# Patient Record
Sex: Male | Born: 1950
Health system: Southern US, Community
[De-identification: ages and names within clinical notes are randomized; demographics above are authoritative.]

## PROBLEM LIST (undated history)

## (undated) DIAGNOSIS — I10 Essential (primary) hypertension: Secondary | ICD-10-CM

## (undated) DIAGNOSIS — M542 Cervicalgia: Secondary | ICD-10-CM

## (undated) DIAGNOSIS — K52839 Microscopic colitis, unspecified: Secondary | ICD-10-CM

## (undated) DIAGNOSIS — M779 Enthesopathy, unspecified: Secondary | ICD-10-CM

## (undated) DIAGNOSIS — F32A Depression, unspecified: Secondary | ICD-10-CM

## (undated) DIAGNOSIS — F329 Major depressive disorder, single episode, unspecified: Secondary | ICD-10-CM

## (undated) DIAGNOSIS — R51 Headache: Secondary | ICD-10-CM

## (undated) DIAGNOSIS — R519 Headache, unspecified: Secondary | ICD-10-CM

## (undated) HISTORY — PX: OTHER SURGICAL HISTORY: SHX169

## (undated) HISTORY — DX: Headache, unspecified: R51.9

## (undated) HISTORY — DX: Major depressive disorder, single episode, unspecified: F32.9

## (undated) HISTORY — DX: Depression, unspecified: F32.A

## (undated) HISTORY — DX: Cervicalgia: M54.2

## (undated) HISTORY — PX: EYE SURGERY: SHX253

## (undated) HISTORY — DX: Enthesopathy, unspecified: M77.9

## (undated) HISTORY — DX: Headache: R51

## (undated) HISTORY — DX: Microscopic colitis, unspecified: K52.839

## (undated) HISTORY — DX: Essential (primary) hypertension: I10

---

## 1999-06-28 ENCOUNTER — Encounter: Payer: Self-pay | Admitting: Neurosurgery

## 1999-06-28 ENCOUNTER — Encounter: Admission: RE | Admit: 1999-06-28 | Discharge: 1999-06-28 | Payer: Self-pay | Admitting: Neurosurgery

## 2003-06-10 ENCOUNTER — Ambulatory Visit (HOSPITAL_COMMUNITY): Admission: RE | Admit: 2003-06-10 | Discharge: 2003-06-10 | Payer: Self-pay | Admitting: Family Medicine

## 2004-03-26 ENCOUNTER — Emergency Department (HOSPITAL_COMMUNITY): Admission: EM | Admit: 2004-03-26 | Discharge: 2004-03-27 | Payer: Self-pay | Admitting: Emergency Medicine

## 2004-04-01 ENCOUNTER — Ambulatory Visit (HOSPITAL_COMMUNITY): Admission: RE | Admit: 2004-04-01 | Discharge: 2004-04-01 | Payer: Self-pay | Admitting: Family Medicine

## 2004-04-08 ENCOUNTER — Ambulatory Visit (HOSPITAL_COMMUNITY): Admission: RE | Admit: 2004-04-08 | Discharge: 2004-04-08 | Payer: Self-pay | Admitting: General Surgery

## 2004-06-15 ENCOUNTER — Ambulatory Visit: Payer: Self-pay | Admitting: Urgent Care

## 2004-06-20 ENCOUNTER — Ambulatory Visit (HOSPITAL_COMMUNITY): Admission: RE | Admit: 2004-06-20 | Discharge: 2004-06-20 | Payer: Self-pay | Admitting: Internal Medicine

## 2004-07-14 ENCOUNTER — Ambulatory Visit: Payer: Self-pay | Admitting: Internal Medicine

## 2004-08-01 ENCOUNTER — Ambulatory Visit: Payer: Self-pay | Admitting: Internal Medicine

## 2004-08-05 ENCOUNTER — Ambulatory Visit (HOSPITAL_COMMUNITY): Admission: RE | Admit: 2004-08-05 | Discharge: 2004-08-05 | Payer: Self-pay | Admitting: Internal Medicine

## 2004-08-05 ENCOUNTER — Ambulatory Visit: Payer: Self-pay | Admitting: Internal Medicine

## 2004-09-16 ENCOUNTER — Ambulatory Visit: Payer: Self-pay | Admitting: Internal Medicine

## 2004-11-08 ENCOUNTER — Ambulatory Visit: Payer: Self-pay | Admitting: Internal Medicine

## 2004-12-05 ENCOUNTER — Ambulatory Visit: Payer: Self-pay | Admitting: Internal Medicine

## 2005-01-30 ENCOUNTER — Ambulatory Visit: Payer: Self-pay | Admitting: Internal Medicine

## 2005-03-13 ENCOUNTER — Ambulatory Visit: Payer: Self-pay | Admitting: Internal Medicine

## 2005-05-05 ENCOUNTER — Ambulatory Visit: Payer: Self-pay | Admitting: Internal Medicine

## 2005-07-31 ENCOUNTER — Ambulatory Visit: Payer: Self-pay | Admitting: Internal Medicine

## 2005-08-09 ENCOUNTER — Ambulatory Visit (HOSPITAL_COMMUNITY): Admission: RE | Admit: 2005-08-09 | Discharge: 2005-08-09 | Payer: Self-pay | Admitting: Internal Medicine

## 2005-08-16 ENCOUNTER — Ambulatory Visit (HOSPITAL_COMMUNITY): Admission: RE | Admit: 2005-08-16 | Discharge: 2005-08-16 | Payer: Self-pay | Admitting: Internal Medicine

## 2005-09-11 ENCOUNTER — Ambulatory Visit: Payer: Self-pay | Admitting: Internal Medicine

## 2006-09-03 ENCOUNTER — Ambulatory Visit (HOSPITAL_COMMUNITY): Admission: RE | Admit: 2006-09-03 | Discharge: 2006-09-03 | Payer: Self-pay | Admitting: Family Medicine

## 2007-11-11 ENCOUNTER — Ambulatory Visit (HOSPITAL_COMMUNITY): Admission: RE | Admit: 2007-11-11 | Discharge: 2007-11-11 | Payer: Self-pay | Admitting: Family Medicine

## 2008-12-09 ENCOUNTER — Ambulatory Visit (HOSPITAL_COMMUNITY): Admission: RE | Admit: 2008-12-09 | Discharge: 2008-12-09 | Payer: Self-pay | Admitting: Family Medicine

## 2010-01-26 ENCOUNTER — Ambulatory Visit (HOSPITAL_COMMUNITY): Admission: RE | Admit: 2010-01-26 | Discharge: 2010-01-26 | Payer: Self-pay | Admitting: Family Medicine

## 2010-02-03 ENCOUNTER — Ambulatory Visit (HOSPITAL_COMMUNITY): Admission: RE | Admit: 2010-02-03 | Discharge: 2010-02-03 | Payer: Self-pay | Admitting: Family Medicine

## 2010-03-18 ENCOUNTER — Ambulatory Visit (HOSPITAL_COMMUNITY): Admission: RE | Admit: 2010-03-18 | Discharge: 2010-03-18 | Payer: Self-pay | Admitting: Family Medicine

## 2010-10-09 LAB — BASIC METABOLIC PANEL
CO2: 29 mEq/L (ref 19–32)
Chloride: 98 mEq/L (ref 96–112)
GFR calc Af Amer: >60 mL/min (ref >60)
GFR calc non Af Amer: >60 mL/min (ref >60)

## 2010-12-09 NOTE — Op Note (Signed)
NAMEGARY, Stephen Andersen              ACCOUNT NO.:  [id number]   MEDICAL RECORD NO.:  [id number]          PATIENT TYPE:  AMB   LOCATION:  DAY                           FACILITY:  [facility]   PHYSICIAN:  Lionel December, M.D.    DATE OF BIRTH:  [birth year]   DATE OF PROCEDURE:  08/05/2004  DATE OF DISCHARGE:                                 OPERATIVE REPORT   PROCEDURE:  Total colonoscopy with terminal ileoscopy.   [name] is 60 year old Caucasian male with 75-month history of diarrhea. He had  a normal colonoscopy by Dr. Lovell Sheehan in September 2005. 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 He was treated with prednisone and started  to have normal BMs. Off therapy, he has developed copious watery diarrhea.  He is therefore undergoing colonoscopy with biopsy prior to further  therapeutic intervention. Procedures were reviewed with the patient.  Informed consent was obtained.   PREMEDICATION:  Demerol 25 mg IV Versed 6 mg IV.   FINDINGS:  Procedure performed in endoscopy suite. The patient's vital signs  and O2 sat were monitored during procedure remained stable. The patient was  placed left lateral position, and rectal examination performed. No  abnormality noted on external or digital exam. Olympus video scope was  placed in rectum and advanced into sigmoid colon and beyond. Preparation was  satisfactory. He had a few areas of petechiae in sigmoid colon with no  erosions or ulcers were noted. He had scattered small diverticula at the  sigmoid and descending colon. Scope was passed to the cecum which was  identified by appendiceal orifice and ileocecal valve. Pictures taken for  the record. Short segment of TI was also examined and revealed normal  mucosa. Biopsy was taken for from TI for routine histology. Biopsies also  taken from the ascending colon and sigmoid colon and submitted in separate  containers.  Rectal mucosa was normal. Scope was retroflexed to examine  anorectal junction and single hemorrhoid was noted below the dentate line.  Endoscope was straightened and withdrawn. The patient tolerated the  procedure well.   FINAL DIAGNOSIS:  No endoscopic evidence of colitis or terminal ileitis.  Biopsies taken from TI, ascending and sigmoid colon for routine histology.  Few scattered diverticula at sigmoid and descending colon. A single external  hemorrhoid.   RECOMMENDATIONS:  CBC will be checked today. If eosinophilia is documented,  he will back on prednisone. Otherwise will wait for further recommendation  until histology reviewed. In the meantime, he will discontinue Imodium and  start Lomotil one t.i.d. or q.i.d.. He will continue Levsin SL before each  meal.     Naje   NR/MEDQ  D:  08/05/2004  T:  08/05/2004  Job:  [id number]   cc:   Corrie Mckusick, M.D.  Fax: [phone]

## 2010-12-09 NOTE — H&P (Signed)
NAME:  Stephen Andersen, Stephen Andersen                        ACCOUNT NO.:  [id number]   MEDICAL RECORD NO.:  [id number]                   PATIENT TYPE:  OUT   LOCATION:  RAD                                  FACILITY:  [facility]   PHYSICIAN:  Dalia Heading, M.D.               DATE OF BIRTH:  1950-10-18   DATE OF ADMISSION:  04/01/2004  DATE OF DISCHARGE:  04/01/2004                                HISTORY & PHYSICAL   CHIEF COMPLAINT:  Abdominal pain, diarrhea.   HISTORY OF PRESENT ILLNESS:  The patient is a 60 year old white male who is  referred for endoscopic evaluation.  He needs colonoscopy for diarrhea and  abdominal pain.  Both have been present for over five weeks.  No weight  loss, nausea, vomiting, melena, constipation, hematochezia have been noted.  He has never had a colonoscopy.  There is no family history of colon  carcinoma.  There is no history of hemorrhoidal disease.   PAST MEDICAL HISTORY:  1.  Depression.  2.  Nerve problems.   PAST SURGICAL HISTORY:  Unremarkable.   CURRENT MEDICATIONS:  1.  Lorcet.  2.  Valium.  3.  Neurontin.  4.  Lexapro.   ALLERGIES:  No known drug allergies.   REVIEW OF SYSTEMS:  The patient does smoke a pack of cigarettes a day.  He  denies any alcohol use.  He denies any other cardiopulmonary difficulties or  bleeding disorders.   PHYSICAL EXAMINATION:  GENERAL:  The patient is a well-developed, well-  nourished white male in no acute distress.  VITAL SIGNS:  Afebrile and vital signs are stable.  LUNGS:  Clear to auscultation with equal breath sounds bilaterally.  HEART:  Regular rate and rhythm without S3, S4, or murmurs.  ABDOMEN:  Soft, nontender, nondistended.  No hepatosplenomegaly or masses  are noted.  RECTAL:  Deferred to the procedure.   IMPRESSION:  1.  Abdominal pain.  2.  Diarrhea.   PLAN:  The patient is scheduled for a colonoscopy on April 08, 2004.  The risks and benefits of the procedure including bleeding and  perforation  were fully explained to the patient, who gave informed consent.     ___________________________________________                                         Dalia Heading, M.D.   MAJ/MEDQ  D:  04/05/2004  T:  04/05/2004  Job:  [id number]   cc:   Corrie Mckusick, M.D.  [address] [place]  [place]  [place] [address]  Fax: [phone]

## 2010-12-09 NOTE — H&P (Signed)
NAMEBROWNING, SOUTHWOOD              ACCOUNT NO.:  [id number]   MEDICAL RECORD NO.:  [id number]           PATIENT TYPE:   LOCATION:                                FACILITY:  [facility]   PHYSICIAN:  R. Roetta Sessions, M.D. DATE OF BIRTH:  01/17/1951   DATE OF ADMISSION:  DATE OF DISCHARGE:  LH                                HISTORY & PHYSICAL   Date of visit:  August 01, 2004.   CHIEF COMPLAINT:  Colonoscopy, refractory diarrhea.   HISTORY OF PRESENT ILLNESS:  Mr. Steele is a 60 year old Caucasian male  who initially came to see [place] in November 2005.  He had a four-month history  of diarrhea at that time.  He was found to have eosinophilia and was started  on prednisone therapy.  Initially his eosinophil count was 1100.  He had  done quite well on the prednisone therapy up until he was seen today.  He  had finished his prednisone taper on July 19, 2004.  He began to have  persistent diarrhea again.  He is also complaining of weakness; therefore, a  CBC and metabolic panel were obtained.  He was found to have hyponatremia  with a sodium of 128.  He was started on prednisone 20 mg taper on that  date.  He presents yesterday, January 9, with worse diarrhea than before.  He is having up to 12 bowel movements, which are loose and watery, per day.  He is complaining of weakness, abdominal cramping, and low abdominal pain.  Denies any fever or chills.  Denies any rectal bleeding or melena.  He  denies any new medications.  Of note, last CBC from July 19, 2004, the  date he completed his prednisone therapy, his eosinophil count was normal,  as was his entire CBC.   CURRENT MEDICATIONS:  1.  Multivitamin daily.  2.  Aspirin 81 mg daily.  3.  Diazepam 2 mg t.i.d.  4.  Lexapro 20 mg one-half tablet daily.  5.  Neurontin 300 mg p.r.n.  6.  Hydrocodone/APAP 10/650 mg p.r.n.  7.  Levsin b.i.d.   PAST MEDICAL HISTORY:  1.  Cervical disk disease, arthritis, and degenerative joint disease.  2.  He is being followed by Dr. Jerre Simon for abnormal CT findings, including a      thickened bladder wall and an enlarged prostate felt to be benign, per      patient.  3.  Denies any surgical history.   ALLERGIES:  No known drug allergies.   FAMILY HISTORY:  No known family history of inflammatory bowel disease,  liver or chronic GI problems.   SOCIAL HISTORY:  Mr. Schrecengost has been married for 14 years.  He is  currently retired.  He reports a 20 pack-year tobacco use history.  He  denies any alcohol or drug use.   REVIEW OF SYSTEMS:  CONSTITUTIONAL:  Weight is up 3-1/2 pounds since  initially seen in our office two months ago.  He denies any night sweats.  He is complaining of significant fatigue.  GASTROINTESTINAL:  See HPI.   PHYSICAL EXAMINATION:  VITAL  SIGNS:  Weight 147.5 pounds, blood pressure  130/80, pulse 80.  GENERAL:  Mr. Lill is a well-developed, well-nourished Caucasian male in  no acute distress.  He is alert and oriented and pleasant and cooperative.  He is accompanied by his wife today.  HEENT:  Sclerae clear, nonicteric.  Conjunctivae pink and moist without any  lesions.  NECK:  Supple without adenopathy or thyromegaly.  CHEST:  Heart regular rate and rhythm, normal S1 and S2, without murmurs,  rubs, thrills, or gallops.  Lungs clear to auscultation bilaterally.  ABDOMEN:  Flat with positive bowel sounds x4.  No bruits auscultated.  Soft,  nontender, nondistended, without palpable mass or hepatosplenomegaly.  No  rebound tenderness or guarding.  EXTREMITIES:  2+ pedal pulses bilaterally.  No edema.  RECTAL:  Deferred.  SKIN:  Pink, warm and dry, without any rash or jaundice.  He does have good  skin turgor.   IMPRESSION:  Mr. Sanmiguel is a 60 year old Caucasian male with a now six-  month history of chronic large-volume diarrhea, felt to be due to  eosinophilic colitis, enterocolitis, or enteritis.  He responded somewhat to  prednisone therapy and would  go from 12 bowel movements a day down to about  three.  He had been doing better on prednisone.  As soon as prednisone was  stopped, the diarrhea began again with up to 12 bowel movements a day.  At  that time, there was no evidence of eosinophilia on complete blood count.  He is found to be hyponatremic.  Not mentioned above, he has had a CT scan  as well as small bowel follow-through, which showed a rapid small bowel  transit time of 30 minutes, on June 20, 2004.  CT scan was negative for  colitis.  CT scan was negative for colitis, lymphoma, etc.  He is requesting  colonoscopy today, and I feel that this is warranted given his refractory  symptoms.   RECOMMENDATIONS:  1.  Will schedule colonoscopy with Dr. Karilyn Cota in the near future.  I have      discussed this procedure, including risks and benefits which include but      are not limited to bleeding, infection, perforation, and drug reaction.      He agrees.  A signed consent will be obtained.  2.  A prescription was given for Levsin one tablet sublingual a.c. and h.s.      p.r.n. diarrhea, #60 with one refill.  3.  Further recommendations pending colonoscopy.  He should continue      prednisone taper at this time.     Kand   KC/MEDQ  D:  08/02/2004  T:  08/02/2004  Job:  [id number]   cc:   Corrie Mckusick, M.D.  Fax: [phone]

## 2011-08-24 ENCOUNTER — Telehealth (INDEPENDENT_AMBULATORY_CARE_PROVIDER_SITE_OTHER): Payer: Self-pay | Admitting: *Deleted

## 2011-08-24 NOTE — Telephone Encounter (Signed)
Stephen Andersen wanting Dr. Karilyn Cota to give him a call at [phone]. He is almost out of his  asacol medication. Please call this in to [facility]. His insurance has changed so he will need a new rx sent in. [facility] no longer gets a 90 day supply.

## 2011-08-25 ENCOUNTER — Other Ambulatory Visit (INDEPENDENT_AMBULATORY_CARE_PROVIDER_SITE_OTHER): Payer: Self-pay | Admitting: Internal Medicine

## 2011-08-25 DIAGNOSIS — K529 Noninfective gastroenteritis and colitis, unspecified: Secondary | ICD-10-CM

## 2011-08-25 MED ORDER — MESALAMINE 400 MG PO TBEC: 800.0000 mg | DELAYED_RELEASE_TABLET | Freq: Two times a day (BID) | ORAL | Status: DC

## 2011-08-25 NOTE — Telephone Encounter (Signed)
Forwarded to Dr. Karilyn Cota to address results and prescription for medication.

## 2011-08-26 NOTE — Telephone Encounter (Signed)
Prescription has been sent to his pharmacy for her is to call at 800 mg twice a day with 5 refills

## 2011-08-30 ENCOUNTER — Ambulatory Visit (INDEPENDENT_AMBULATORY_CARE_PROVIDER_SITE_OTHER): Payer: Medicare Other | Admitting: Internal Medicine

## 2011-08-30 ENCOUNTER — Encounter (INDEPENDENT_AMBULATORY_CARE_PROVIDER_SITE_OTHER): Payer: Self-pay | Admitting: Internal Medicine

## 2011-08-30 DIAGNOSIS — K5289 Other specified noninfective gastroenteritis and colitis: Secondary | ICD-10-CM | POA: Diagnosis not present

## 2011-08-30 DIAGNOSIS — I10 Essential (primary) hypertension: Secondary | ICD-10-CM

## 2011-08-30 DIAGNOSIS — K52839 Microscopic colitis, unspecified: Secondary | ICD-10-CM | POA: Insufficient documentation

## 2011-08-30 NOTE — Progress Notes (Signed)
Subjective:     Patient ID: Stephen Andersen, male   DOB: 1951-02-22, 61 y.o.   MRN: [id number]  HPI  Greely is a 61 yr old male here today for f/u of his microscopic colitis. He was diagnosed in 2006.  He is maintained on Asacol.  He says he is doing good. He is having one stool a day and solid. No diarrhea. Appetite is good.  There has been no weight loss over the past year. No abdominal pain. No melena, or bright red rectal bleeding. No melena.   08/05/2004 Colonoscopy:  FINAL DIAGNOSIS: No endoscopic evidence of colitis or terminal ileitis.  Biopsies taken from TI, ascending and sigmoid colon for routine histology.  Few scattered diverticula at sigmoid and descending colon. A single external  Hemorrhoid. Biopsy revealed microscopic colitis.   Review of Systems see hpi Current Outpatient Prescriptions  Medication Sig Dispense Refill  . alfuzosin (UROXATRAL) 10 MG 24 hr tablet Take 10 mg by mouth daily.      Marland Kitchen HYDROcodone-acetaminophen (LORCET) 10-650 MG per tablet Take 1 tablet by mouth every 6 (six) hours as needed.      . venlafaxine (EFFEXOR) 75 MG tablet Take 75 mg by mouth daily.      . mesalamine (ASACOL) 400 MG EC tablet Take 2 tablets (800 mg total) by mouth 2 (two) times daily.  120 tablet  5   Past Medical History  Diagnosis Date  . Hypertension   . Microscopic colitis   . Neck pain   . Bone spur    History reviewed. No pertinent past surgical history. Family Status  Relation Status Death Age  . Mother Alive     good health  . Father Deceased     MI, kidney problems age 58  . Sister Alive     good health  . Brother Alive     MI and 2 stents   History   Social History  . Marital Status: Married    Spouse Name: N/A    Number of Children: N/A  . Years of Education: N/A   Occupational History  . Not on file.   Social History Main Topics  . Smoking status: Current Everyday Smoker  . Smokeless tobacco: Not on file   Comment: 1 pack a day x 30 yrs  . Alcohol  Use: No  . Drug Use: No  . Sexually Active: Not on file   Other Topics Concern  . Not on file   Social History Narrative  . No narrative on file   Allergies no known allergies     Objective:   Physical Exam  Filed Vitals:   08/30/11 1439  Height: 5' 5.5" (1.664 m)  Weight: 119 lb 6.4 oz (54.159 kg)    Alert and oriented. Skin warm and dry. Oral mucosa is moist.   . Sclera anicteric, conjunctivae is pink. Thyroid not enlarged. No cervical lymphadenopathy. Lungs clear. Heart regular rate and rhythm.  Abdomen is soft. Bowel sounds are positive. No hepatomegaly. No abdominal masses felt. No tenderness.  No edema to lower extremities. Patient is alert and oriented.      Assessment:    Microscopic colitis which appears to be in remission. He is maintained on Asacol.     Plan:    F/u in one year.  Will get blood work from Dr Regino Schultze

## 2011-08-30 NOTE — Patient Instructions (Signed)
F/u in one year. Continue present medications. Will get lab work from [facility]

## 2011-09-26 DIAGNOSIS — IMO0002 Reserved for concepts with insufficient information to code with codable children: Secondary | ICD-10-CM | POA: Diagnosis not present

## 2011-09-26 DIAGNOSIS — F329 Major depressive disorder, single episode, unspecified: Secondary | ICD-10-CM | POA: Diagnosis not present

## 2011-09-26 DIAGNOSIS — G8929 Other chronic pain: Secondary | ICD-10-CM | POA: Diagnosis not present

## 2011-09-26 DIAGNOSIS — J019 Acute sinusitis, unspecified: Secondary | ICD-10-CM | POA: Diagnosis not present

## 2011-10-02 DIAGNOSIS — K115 Sialolithiasis: Secondary | ICD-10-CM | POA: Diagnosis not present

## 2012-04-02 DIAGNOSIS — Z0000 Encounter for general adult medical examination without abnormal findings: Secondary | ICD-10-CM | POA: Diagnosis not present

## 2012-04-02 DIAGNOSIS — Z23 Encounter for immunization: Secondary | ICD-10-CM | POA: Diagnosis not present

## 2012-04-02 DIAGNOSIS — Z79899 Other long term (current) drug therapy: Secondary | ICD-10-CM | POA: Diagnosis not present

## 2012-04-02 DIAGNOSIS — J449 Chronic obstructive pulmonary disease, unspecified: Secondary | ICD-10-CM | POA: Diagnosis not present

## 2012-05-07 ENCOUNTER — Ambulatory Visit (INDEPENDENT_AMBULATORY_CARE_PROVIDER_SITE_OTHER): Payer: Medicare Other | Admitting: Urology

## 2012-05-07 DIAGNOSIS — K4020 Bilateral inguinal hernia, without obstruction or gangrene, not specified as recurrent: Secondary | ICD-10-CM | POA: Diagnosis not present

## 2012-05-07 DIAGNOSIS — R972 Elevated prostate specific antigen [PSA]: Secondary | ICD-10-CM | POA: Diagnosis not present

## 2012-09-13 ENCOUNTER — Encounter (INDEPENDENT_AMBULATORY_CARE_PROVIDER_SITE_OTHER): Payer: Self-pay | Admitting: *Deleted

## 2012-09-26 ENCOUNTER — Ambulatory Visit (INDEPENDENT_AMBULATORY_CARE_PROVIDER_SITE_OTHER): Payer: Medicare Other | Admitting: Internal Medicine

## 2012-09-26 ENCOUNTER — Encounter (INDEPENDENT_AMBULATORY_CARE_PROVIDER_SITE_OTHER): Payer: Self-pay | Admitting: Internal Medicine

## 2012-09-26 VITALS — BP 133/84 | HR 84 | Ht 65.5 in | Wt 122.2 lb

## 2012-09-26 DIAGNOSIS — K52839 Microscopic colitis, unspecified: Secondary | ICD-10-CM

## 2012-09-26 DIAGNOSIS — K5289 Other specified noninfective gastroenteritis and colitis: Secondary | ICD-10-CM | POA: Diagnosis not present

## 2012-09-26 NOTE — Patient Instructions (Addendum)
Continue the Asacol 

## 2012-09-26 NOTE — Progress Notes (Signed)
Subjective:     Patient ID: Stephen Andersen, male   DOB: 23-Mar-1951, 62 y.o.   MRN: [id number]  HPI Here today for f/u of his lymphocystic colitis. Per Dr. Patty Sermons notes from 04/08/2009 He presented over 7 yrs ago with wt loss and an extensive work up was negative. He developed diarrhea and noted to have eosinophilia. Colonoscopy in 2006 revealed lymphocystic colitis. Treated with Prednisone and Asacol. He remains on Asacol at this time. He see urologist from [facility] for a fluctuating PSA. He tells me he is doing well. Appetite is good. NO weight loss. No abdominal pain. He tells me he occasionally skips some doses of his Asacol. BMs are normal.  No melena or bright red rectal bleeding. Usually ha a BM once a day.   Family hx is negative for colon cancer.  07/2004 colonoscopy with ileoscopy; FINAL DIAGNOSIS: No endoscopic evidence of colitis or terminal ileitis.  Biopsies taken from TI, ascending and sigmoid colon for routine histology.  Few scattered diverticula at sigmoid and descending colon. A single external  hemorrhoid.   Review of Systems see hpi Current Outpatient Prescriptions  Medication Sig Dispense Refill  . aspirin 81 MG tablet Take 81 mg by mouth daily.      . diazepam (VALIUM) 10 MG tablet Take 10 mg by mouth every 12 (twelve) hours as needed for anxiety.      Marland Kitchen HYDROcodone-acetaminophen (LORCET) 10-650 MG per tablet Take 1 tablet by mouth every 6 (six) hours as needed.      Marland Kitchen NIFEdipine (PROCARDIA-XL/ADALAT CC) 30 MG 24 hr tablet Take 30 mg by mouth daily.      Marland Kitchen venlafaxine (EFFEXOR) 75 MG tablet Take 75 mg by mouth daily.      . mesalamine (ASACOL) 400 MG EC tablet Take 2 tablets (800 mg total) by mouth 2 (two) times daily.  120 tablet  5   No current facility-administered medications for this visit.   Past Medical History  Diagnosis Date  . Hypertension   . Microscopic colitis   . Neck pain   . Bone spur    History reviewed. No pertinent past surgical  history. No Known Allergies      Objective:   Physical Exam  Filed Vitals:   09/26/12 1014  BP: 133/84  Pulse: 84  Height: 5' 5.5" (1.664 m)  Weight: 122 lb 3.2 oz (55.43 kg)   Alert and oriented. Skin warm and dry. Oral mucosa is moist.   . Sclera anicteric, conjunctivae is pink. Thyroid not enlarged. No cervical lymphadenopathy. Lungs clear. Heart regular rate and rhythm.  Abdomen is soft. Bowel sounds are positive. No hepatomegaly. No abdominal masses felt. No tenderness.  No edema to lower extremities.       Assessment:    Lymphocystic colitis. Patient is in remission at this time.    Plan:  Will get blood work from [facility] done in October of 2013. OV in 1 yr. Continue Asacol 400mg  daily, Samples of Asacol 400mg  x 6 boxes given to patient.

## 2012-12-11 ENCOUNTER — Encounter (INDEPENDENT_AMBULATORY_CARE_PROVIDER_SITE_OTHER): Payer: Self-pay

## 2013-01-16 DIAGNOSIS — G8929 Other chronic pain: Secondary | ICD-10-CM | POA: Diagnosis not present

## 2013-01-16 DIAGNOSIS — M5090 Cervical disc disorder, unspecified, unspecified cervical region: Secondary | ICD-10-CM | POA: Diagnosis not present

## 2013-01-16 DIAGNOSIS — IMO0002 Reserved for concepts with insufficient information to code with codable children: Secondary | ICD-10-CM | POA: Diagnosis not present

## 2013-04-15 DIAGNOSIS — Z125 Encounter for screening for malignant neoplasm of prostate: Secondary | ICD-10-CM | POA: Diagnosis not present

## 2013-04-15 DIAGNOSIS — Z79899 Other long term (current) drug therapy: Secondary | ICD-10-CM | POA: Diagnosis not present

## 2013-04-15 DIAGNOSIS — K5289 Other specified noninfective gastroenteritis and colitis: Secondary | ICD-10-CM | POA: Diagnosis not present

## 2013-04-15 DIAGNOSIS — J449 Chronic obstructive pulmonary disease, unspecified: Secondary | ICD-10-CM | POA: Diagnosis not present

## 2013-04-15 DIAGNOSIS — I10 Essential (primary) hypertension: Secondary | ICD-10-CM | POA: Diagnosis not present

## 2013-04-15 DIAGNOSIS — IMO0002 Reserved for concepts with insufficient information to code with codable children: Secondary | ICD-10-CM | POA: Diagnosis not present

## 2013-04-15 DIAGNOSIS — F329 Major depressive disorder, single episode, unspecified: Secondary | ICD-10-CM | POA: Diagnosis not present

## 2013-04-15 DIAGNOSIS — Z0000 Encounter for general adult medical examination without abnormal findings: Secondary | ICD-10-CM | POA: Diagnosis not present

## 2013-04-15 DIAGNOSIS — Z23 Encounter for immunization: Secondary | ICD-10-CM | POA: Diagnosis not present

## 2013-04-29 ENCOUNTER — Ambulatory Visit (INDEPENDENT_AMBULATORY_CARE_PROVIDER_SITE_OTHER): Payer: Medicare Other | Admitting: Urology

## 2013-04-29 DIAGNOSIS — N320 Bladder-neck obstruction: Secondary | ICD-10-CM | POA: Diagnosis not present

## 2013-04-29 DIAGNOSIS — R972 Elevated prostate specific antigen [PSA]: Secondary | ICD-10-CM | POA: Diagnosis not present

## 2013-07-09 ENCOUNTER — Encounter (INDEPENDENT_AMBULATORY_CARE_PROVIDER_SITE_OTHER): Payer: Self-pay | Admitting: *Deleted

## 2013-09-30 ENCOUNTER — Ambulatory Visit (INDEPENDENT_AMBULATORY_CARE_PROVIDER_SITE_OTHER): Payer: Medicare Other | Admitting: Internal Medicine

## 2013-10-13 ENCOUNTER — Encounter (INDEPENDENT_AMBULATORY_CARE_PROVIDER_SITE_OTHER): Payer: Self-pay | Admitting: Internal Medicine

## 2013-10-13 ENCOUNTER — Ambulatory Visit (INDEPENDENT_AMBULATORY_CARE_PROVIDER_SITE_OTHER): Payer: Medicare Other | Admitting: Internal Medicine

## 2013-10-13 VITALS — BP 130/80 | HR 82 | Temp 97.7°F | Resp 18 | Ht 65.5 in | Wt 126.1 lb

## 2013-10-13 DIAGNOSIS — M542 Cervicalgia: Secondary | ICD-10-CM

## 2013-10-13 DIAGNOSIS — G8929 Other chronic pain: Secondary | ICD-10-CM

## 2013-10-13 DIAGNOSIS — K5289 Other specified noninfective gastroenteritis and colitis: Secondary | ICD-10-CM | POA: Diagnosis not present

## 2013-10-13 DIAGNOSIS — K52839 Microscopic colitis, unspecified: Secondary | ICD-10-CM

## 2013-10-13 MED ORDER — MESALAMINE 400 MG PO CPDR: 800.0000 mg | DELAYED_RELEASE_CAPSULE | Freq: Two times a day (BID) | ORAL | Status: DC

## 2013-10-13 NOTE — Patient Instructions (Addendum)
Can stop Delzicol in December 2015 three months prior to colonoscopy Colonoscopy to be scheduled in March 2016.

## 2013-10-13 NOTE — Progress Notes (Signed)
Presenting complaint;  Followup for microscopic colitis/diarrhea.  Subjective:  Patient is 63 year old Caucasian male who presents for yearly visit. He has history of chronic diarrhea secondary to microscopic colitis diagnosed in January 2006. Initially he was treated with steroids and has been maintained on mesalamine. He states he is doing well. He is very anxious to come off this medication. Few years ago this attempt resulted in relapse of his diarrhea. He is having 1 formed stool daily. He denies abdominal pain melena or rectal bleeding. He states he has very good appetite and does not understand why he cannot gain weight. However he has gained 4 pounds in last one year. He complains of chronic right-sided neck pain due to nerve damage. He's been smoking off and on since he was 63 years old. Presently he is smoking one pack per day. Patient reports he had blood work at the time of his physical exam in September last year and was normal.   Current Medications: Outpatient Encounter Prescriptions as of 10/13/2013  Medication Sig  . aspirin 81 MG tablet Take 81 mg by mouth daily.  . diazepam (VALIUM) 10 MG tablet Take 10 mg by mouth every 12 (twelve) hours as needed for anxiety.  Marland Kitchen HYDROcodone-acetaminophen (LORCET) 10-650 MG per tablet Take 1 tablet by mouth every 6 (six) hours as needed.  . Mesalamine (DELZICOL) 400 MG CPDR DR capsule Take 400 mg by mouth daily. Patient states that he is staking only 1 a day , and does not take everyday  . Multiple Vitamin (MULTI VITAMIN DAILY PO) Take by mouth daily.  Marland Kitchen NIFEdipine (PROCARDIA-XL/ADALAT CC) 30 MG 24 hr tablet Take 30 mg by mouth daily.  Marland Kitchen venlafaxine (EFFEXOR) 75 MG tablet Take 75 mg by mouth daily.  . mesalamine (ASACOL) 400 MG EC tablet Take 2 tablets (800 mg total) by mouth 2 (two) times daily.     Objective: Blood pressure 130/80, pulse 82, temperature 97.7 F (36.5 C), temperature source Oral, resp. rate 18, height 5' 5.5" (1.664  m), weight 126 lb 1.6 oz (57.199 kg). Well-developed thin Caucasian male in NAD. Conjunctiva is pink. Sclera is nonicteric Oropharyngeal mucosa is normal. No neck masses or thyromegaly noted. Cardiac exam with regular rhythm normal S1 and S2. No murmur or gallop noted. Lungs are clear to auscultation. Abdomen is flat. Bowel sounds are normal. On palpation is soft and nontender without organomegaly or masses. No LE edema or clubbing noted.  Labs/studies Results:  Assessment:  #1. Microscopic colitis. Condition was diagnosed in January 2006 and he has remained in remission on oral mesalamine. He is anxious to come off this medication. He will be due for screening colonoscopy next year he should continue therapy until 3 months prior to the procedure.   Plan:  Delzicol 800 mg by mouth twice a day. New prescription given. Delzicol will be discontinued in December 2015 three months prior to screening colonoscopy to be scheduled in March 2016.

## 2013-10-14 DIAGNOSIS — M542 Cervicalgia: Secondary | ICD-10-CM

## 2013-10-14 DIAGNOSIS — G8929 Other chronic pain: Secondary | ICD-10-CM | POA: Insufficient documentation

## 2013-12-02 ENCOUNTER — Encounter (HOSPITAL_COMMUNITY): Payer: Self-pay | Admitting: Emergency Medicine

## 2013-12-02 ENCOUNTER — Emergency Department (HOSPITAL_COMMUNITY)
Admission: EM | Admit: 2013-12-02 | Discharge: 2013-12-02 | Disposition: A | Discharge status: Home / Self Care | Payer: Medicare Other | Attending: Emergency Medicine | Admitting: Emergency Medicine

## 2013-12-02 DIAGNOSIS — Y929 Unspecified place or not applicable: Secondary | ICD-10-CM | POA: Insufficient documentation

## 2013-12-02 DIAGNOSIS — Y9389 Activity, other specified: Secondary | ICD-10-CM | POA: Insufficient documentation

## 2013-12-02 DIAGNOSIS — Z7982 Long term (current) use of aspirin: Secondary | ICD-10-CM | POA: Insufficient documentation

## 2013-12-02 DIAGNOSIS — W208XXA Other cause of strike by thrown, projected or falling object, initial encounter: Secondary | ICD-10-CM | POA: Insufficient documentation

## 2013-12-02 DIAGNOSIS — Z79899 Other long term (current) drug therapy: Secondary | ICD-10-CM | POA: Diagnosis not present

## 2013-12-02 DIAGNOSIS — S0100XA Unspecified open wound of scalp, initial encounter: Secondary | ICD-10-CM | POA: Insufficient documentation

## 2013-12-02 DIAGNOSIS — Z8739 Personal history of other diseases of the musculoskeletal system and connective tissue: Secondary | ICD-10-CM | POA: Insufficient documentation

## 2013-12-02 DIAGNOSIS — F17200 Nicotine dependence, unspecified, uncomplicated: Secondary | ICD-10-CM | POA: Insufficient documentation

## 2013-12-02 DIAGNOSIS — I10 Essential (primary) hypertension: Secondary | ICD-10-CM | POA: Diagnosis not present

## 2013-12-02 DIAGNOSIS — S0101XA Laceration without foreign body of scalp, initial encounter: Secondary | ICD-10-CM

## 2013-12-02 DIAGNOSIS — K5289 Other specified noninfective gastroenteritis and colitis: Secondary | ICD-10-CM | POA: Diagnosis not present

## 2013-12-02 MED ORDER — POVIDONE-IODINE 10 % EX SOLN
CUTANEOUS | Status: AC
  Filled 2013-12-02: qty 118

## 2013-12-02 NOTE — ED Notes (Signed)
Dr Dina Rich at bedside, wound care performed by Dr Dina Rich, pt tolerated well,

## 2013-12-02 NOTE — ED Notes (Signed)
Pt presents with small laceration to the top of his head. Pt states a "piece of metal" fell onto his head. Pt denies LOC. Pt ambulatory on arrival to ED.

## 2013-12-02 NOTE — ED Provider Notes (Signed)
CSN: [id number]     Arrival date & time 12/02/13  1150 History   First MD Initiated Contact with Patient 12/02/13 1251     Chief Complaint  Patient presents with  . Head Injury     (Consider location/radiation/quality/duration/timing/severity/associated sxs/prior Treatment) HPI  Patient presents with a laceration to the scalp. The patient reports that a small piece of metal fell and hit him on the top of the head. He denies loss of consciousness. Last tetanus was less than 5 years ago. He denies any anticoagulant use it only on baby aspirin. He denies any other injury.  Past Medical History  Diagnosis Date  . Hypertension   . Microscopic colitis   . Neck pain   . Bone spur    History reviewed. No pertinent past surgical history. History reviewed. No pertinent family history. History  Substance Use Topics  . Smoking status: Current Every Day Smoker  . Smokeless tobacco: Never Used     Comment: 1 pack a day x 30 yrs  . Alcohol Use: No    Review of Systems  Skin: Positive for wound.  Neurological: Negative for dizziness and headaches.  All other systems reviewed and are negative.     Allergies  Review of patient's allergies indicates no known allergies.  Home Medications   Prior to Admission medications   Medication Sig Start Date End Date Taking? Authorizing Provider  aspirin 81 MG tablet Take 81 mg by mouth daily.   Yes Historical Provider, MD  diazepam (VALIUM) 10 MG tablet Take 10 mg by mouth every 12 (twelve) hours as needed for anxiety.   Yes Historical Provider, MD  HYDROcodone-acetaminophen (LORCET) 10-650 MG per tablet Take 1 tablet by mouth every 6 (six) hours as needed for pain.    Yes Historical Provider, MD  Mesalamine (ASACOL) 400 MG CPDR DR capsule Take 800 mg by mouth 2 (two) times daily. 10/13/13  Yes Rogene Houston, MD  Multiple Vitamin (MULTI VITAMIN DAILY PO) Take by mouth daily.   Yes Historical Provider, MD  NIFEdipine (PROCARDIA-XL/ADALAT CC) 30  MG 24 hr tablet Take 30 mg by mouth daily.   Yes Historical Provider, MD  venlafaxine XR (EFFEXOR-XR) 37.5 MG 24 hr capsule Take 37.5 mg by mouth daily with breakfast.   Yes Historical Provider, MD   BP 132/73  Pulse 73  Temp(Src) 97.4 F (36.3 C) (Oral)  Resp 18  SpO2 100% Physical Exam  Nursing note and vitals reviewed. Constitutional: He is oriented to person, place, and time. He appears well-developed and well-nourished. No distress.  HENT:  Head: Normocephalic.  2-3 centimeter laceration over the vertex of the scalp, bleeding controlled  Eyes: Pupils are equal, round, and reactive to light.  Neck: Neck supple.  Cardiovascular: Normal rate and regular rhythm.   Pulmonary/Chest: Effort normal. No respiratory distress.  Musculoskeletal: He exhibits no edema.  Lymphadenopathy:    He has no cervical adenopathy.  Neurological: He is alert and oriented to person, place, and time.  Skin: Skin is warm and dry.  Psychiatric: He has a normal mood and affect.    ED Course  LACERATION REPAIR Date/Time: 12/02/2013 1:28 PM Performed by: Thayer Jew, F Authorized by: Thayer Jew, F Consent: Verbal consent obtained. Consent given by: patient Body area: head/neck Location details: scalp Laceration length: 3 cm Foreign bodies: no foreign bodies Tendon involvement: none Nerve involvement: none Vascular damage: no Irrigation solution: saline Irrigation method: syringe Amount of cleaning: standard Debridement: none Degree of undermining: none Approximation:  loose Approximation difficulty: simple Comments: Skin closure with apposition and dermabond   (including critical care time) Labs Review Labs Reviewed - No data to display  Imaging Review No results found.   EKG Interpretation None      MDM   Final diagnoses:  Scalp laceration    Patient presents with a simple scalp laceration. No other signs of trauma. Tetanus up-to-date. Laceration was repaired with  hair apposition technique and Dermabond. Patient tolerated the procedure well. He was given return precautions.  After history, exam, and medical workup I feel the patient has been appropriately medically screened and is safe for discharge home. Pertinent diagnoses were discussed with the patient. Patient was given return precautions.     Merryl Hacker, MD 12/02/13 1330

## 2013-12-02 NOTE — ED Notes (Signed)
Pt was helping to "jack up" a shed when a piece of pipe fell and hit pt in head, pt has laceration to left side of head, (parietal region), no bleeding noted at present, last tetanus was <5 years ago, denies any loc, denies any neck pain or any other injury, does take aspirin 81 mg daily,

## 2013-12-02 NOTE — Discharge Instructions (Signed)
Tissue Adhesive Wound Care °Some cuts, wounds, lacerations, and incisions can be repaired by using tissue adhesive. Tissue adhesive is like glue. It holds the skin together, allowing for faster healing. It forms a strong bond on the skin in about 1 minute and reaches its full strength in about 2 or 3 minutes. The adhesive disappears naturally while the wound is healing. It is important to take proper care of your wound at home while it heals.  °HOME CARE INSTRUCTIONS  °· Showers are allowed. Do not soak the area containing the tissue adhesive. Do not take baths, swim, or use hot tubs. Do not use any soaps or ointments on the wound. Certain ointments can weaken the glue. °· If a bandage (dressing) has been applied, follow your health care provider's instructions for how often to change the dressing.   °· Keep the dressing dry if one has been applied.   °· Do not scratch, pick, or rub the adhesive.   °· Do not place tape over the adhesive. The adhesive could come off when pulling the tape off.   °· Protect the wound from further injury until it is healed.   °· Protect the wound from sun and tanning bed exposure while it is healing and for several weeks after healing.   °· Only take over-the-counter or prescription medicines as directed by your health care provider.   °· Keep all follow-up appointments as directed by your health care provider. °SEEK IMMEDIATE MEDICAL CARE IF:  °· Your wound becomes red, swollen, hot, or tender.   °· You develop a rash after the glue is applied. °· You have increasing pain in the wound.   °· You have a red streak that goes away from the wound.   °· You have pus coming from the wound.   °· You have increased bleeding. °· You have a fever. °· You have shaking chills.   °· You notice a bad smell coming from the wound.   °· Your wound or adhesive breaks open.   °MAKE SURE YOU:  °· Understand these instructions. °· Will watch your condition. °· Will get help right away if you are not doing  well or get worse. °Document Released: 01/03/2001 Document Revised: 04/30/2013 Document Reviewed: 01/29/2013 °ExitCare® Patient Information ©2014 [employer]. ° °

## 2013-12-18 DIAGNOSIS — K5289 Other specified noninfective gastroenteritis and colitis: Secondary | ICD-10-CM | POA: Diagnosis not present

## 2013-12-18 DIAGNOSIS — G8929 Other chronic pain: Secondary | ICD-10-CM | POA: Diagnosis not present

## 2013-12-18 DIAGNOSIS — IMO0002 Reserved for concepts with insufficient information to code with codable children: Secondary | ICD-10-CM | POA: Diagnosis not present

## 2014-04-16 DIAGNOSIS — Z0000 Encounter for general adult medical examination without abnormal findings: Secondary | ICD-10-CM | POA: Diagnosis not present

## 2014-04-16 DIAGNOSIS — Z681 Body mass index (BMI) 19 or less, adult: Secondary | ICD-10-CM | POA: Diagnosis not present

## 2014-04-16 DIAGNOSIS — Z23 Encounter for immunization: Secondary | ICD-10-CM | POA: Diagnosis not present

## 2014-04-17 DIAGNOSIS — R7309 Other abnormal glucose: Secondary | ICD-10-CM | POA: Diagnosis not present

## 2014-09-14 ENCOUNTER — Other Ambulatory Visit (INDEPENDENT_AMBULATORY_CARE_PROVIDER_SITE_OTHER): Payer: Self-pay | Admitting: Internal Medicine

## 2014-10-13 DIAGNOSIS — G894 Chronic pain syndrome: Secondary | ICD-10-CM | POA: Diagnosis not present

## 2014-10-13 DIAGNOSIS — Z681 Body mass index (BMI) 19 or less, adult: Secondary | ICD-10-CM | POA: Diagnosis not present

## 2014-10-13 DIAGNOSIS — L0100 Impetigo, unspecified: Secondary | ICD-10-CM | POA: Diagnosis not present

## 2014-11-12 DIAGNOSIS — L281 Prurigo nodularis: Secondary | ICD-10-CM | POA: Diagnosis not present

## 2014-12-14 DIAGNOSIS — L281 Prurigo nodularis: Secondary | ICD-10-CM | POA: Diagnosis not present

## 2015-01-18 DIAGNOSIS — L299 Pruritus, unspecified: Secondary | ICD-10-CM | POA: Diagnosis not present

## 2015-01-18 DIAGNOSIS — L281 Prurigo nodularis: Secondary | ICD-10-CM | POA: Diagnosis not present

## 2015-01-18 DIAGNOSIS — L500 Allergic urticaria: Secondary | ICD-10-CM | POA: Diagnosis not present

## 2015-01-18 DIAGNOSIS — L509 Urticaria, unspecified: Secondary | ICD-10-CM | POA: Diagnosis not present

## 2015-01-19 DIAGNOSIS — L299 Pruritus, unspecified: Secondary | ICD-10-CM | POA: Diagnosis not present

## 2015-01-28 DIAGNOSIS — T402X5D Adverse effect of other opioids, subsequent encounter: Secondary | ICD-10-CM | POA: Diagnosis not present

## 2015-02-12 DIAGNOSIS — Z1389 Encounter for screening for other disorder: Secondary | ICD-10-CM | POA: Diagnosis not present

## 2015-02-12 DIAGNOSIS — L0100 Impetigo, unspecified: Secondary | ICD-10-CM | POA: Diagnosis not present

## 2015-02-12 DIAGNOSIS — Z681 Body mass index (BMI) 19 or less, adult: Secondary | ICD-10-CM | POA: Diagnosis not present

## 2015-02-15 ENCOUNTER — Encounter (INDEPENDENT_AMBULATORY_CARE_PROVIDER_SITE_OTHER): Payer: Self-pay | Admitting: Internal Medicine

## 2015-02-15 ENCOUNTER — Ambulatory Visit (INDEPENDENT_AMBULATORY_CARE_PROVIDER_SITE_OTHER): Payer: Medicare Other | Admitting: Internal Medicine

## 2015-02-15 VITALS — BP 122/74 | HR 80 | Temp 97.6°F | Ht 64.0 in | Wt 119.4 lb

## 2015-02-15 DIAGNOSIS — L281 Prurigo nodularis: Secondary | ICD-10-CM

## 2015-02-15 DIAGNOSIS — K52839 Microscopic colitis, unspecified: Secondary | ICD-10-CM

## 2015-02-15 DIAGNOSIS — K5289 Other specified noninfective gastroenteritis and colitis: Secondary | ICD-10-CM

## 2015-02-15 NOTE — Progress Notes (Addendum)
   Subjective:    Patient ID: Stephen Andersen, male    DOB: 09/02/1950, 64 y.o.   MRN: [id number]  HPI Presents today for f/u of his microscopic  colitis.  He underwent a colonoscopy in 2006 which revealed lymphocytic colitis.  He tells me he has had a rash for almost a year. He tells me the rash started on the top of his left foot and gradually covered his entire body. He started the Delizol about 3 months before the rash developed. He had been maintained on Asacol.  Saw Dr. Nevada Crane x 3 . Biopsy was negative for cancer. Pathologist report ? Drug reaction. Prurigo nodularis.  He says the rash itches.  Patient also saw Penn[facility] Bomb[facility] and was started on Bactrim 2 days ago for the rash.  Appetite is good. No weight loss. Usually has a a BM once in the am. He has lost about 6 pounds since his last visit last year.  No melena or BRRB.  Family hx is negative for colon cancer.  07/2004 colonoscopy with ileoscopy; FINAL DIAGNOSIS: No endoscopic evidence of colitis or terminal ileitis.  Biopsies taken from TI, ascending and sigmoid colon for routine histology.  Few scattered diverticula at sigmoid and descending colon. A single external  hemorrhoid.  Review of Systems Past Medical History  Diagnosis Date  . Hypertension   . Microscopic colitis   . Neck pain   . Bone spur     No past surgical history on file.  No Known Allergies  Current Outpatient Prescriptions on File Prior to Visit  Medication Sig Dispense Refill  . aspirin 81 MG tablet Take 81 mg by mouth daily.    . diazepam (VALIUM) 10 MG tablet Take 10 mg by mouth every 12 (twelve) hours as needed for anxiety.    Marland Kitchen HYDROcodone-acetaminophen (LORCET) 10-650 MG per tablet Take 1 tablet by mouth every 6 (six) hours as needed for pain.     . Multiple Vitamin (MULTI VITAMIN DAILY PO) Take by mouth daily.    Marland Kitchen NIFEdipine (PROCARDIA-XL/ADALAT CC) 30 MG 24 hr tablet Take 30 mg by mouth daily.    Marland Kitchen venlafaxine XR (EFFEXOR-XR) 37.5 MG 24 hr  capsule Take 37.5 mg by mouth daily with breakfast.    . DELZICOL 400 MG CPDR DR capsule TAKE 2 CAPSULES BY MOUTH TWICE DAILY (Patient not taking: Reported on 02/15/2015) 120 capsule 11  . Mesalamine (ASACOL) 400 MG CPDR DR capsule Take 800 mg by mouth 2 (two) times daily.     No current facility-administered medications on file prior to visit.        Objective:   Physical Exam Blood pressure 122/74, pulse 80, temperature 97.6 F (36.4 C), height 5\' 4"  (1.626 m), weight 119 lb 6.4 oz (54.159 kg). Alert and oriented. Skin warm and dry. Oral mucosa is moist.   . Sclera anicteric, conjunctivae is pink. Thyroid not enlarged. No cervical lymphadenopathy. Lungs clear. Heart regular rate and rhythm.  Abdomen is soft. Bowel sounds are positive. No hepatomegaly. No abdominal masses felt. No tenderness.  No edema to lower extremities.  Open, and scabbed rash all over).  Dr. Laural Golden in with patjent.            Assessment & Plan:  MIcroscopic colitis.  Has been off Delizol for about a week.  Rash ? Drug related.  D/C athe Delzicol. Will schedule a colonoscopy for 3 months.

## 2015-02-15 NOTE — Patient Instructions (Addendum)
Colonoscopy in 3 months. The risks and benefits such as perforation, bleeding, and infection were reviewed with the patient and is agreeable.

## 2015-03-16 DIAGNOSIS — L281 Prurigo nodularis: Secondary | ICD-10-CM | POA: Diagnosis not present

## 2015-04-20 ENCOUNTER — Encounter (INDEPENDENT_AMBULATORY_CARE_PROVIDER_SITE_OTHER): Payer: Self-pay | Admitting: *Deleted

## 2015-04-23 DIAGNOSIS — Z1389 Encounter for screening for other disorder: Secondary | ICD-10-CM | POA: Diagnosis not present

## 2015-04-23 DIAGNOSIS — Z681 Body mass index (BMI) 19 or less, adult: Secondary | ICD-10-CM | POA: Diagnosis not present

## 2015-04-23 DIAGNOSIS — Z23 Encounter for immunization: Secondary | ICD-10-CM | POA: Diagnosis not present

## 2015-04-23 DIAGNOSIS — G894 Chronic pain syndrome: Secondary | ICD-10-CM | POA: Diagnosis not present

## 2015-05-06 DIAGNOSIS — H2513 Age-related nuclear cataract, bilateral: Secondary | ICD-10-CM | POA: Diagnosis not present

## 2015-05-06 DIAGNOSIS — H538 Other visual disturbances: Secondary | ICD-10-CM | POA: Diagnosis not present

## 2015-06-01 ENCOUNTER — Ambulatory Visit (HOSPITAL_COMMUNITY)
Admission: EM | Admit: 2015-06-01 | Discharge: 2015-06-02 | Disposition: A | Discharge status: Home / Self Care | Payer: Medicare Other | Attending: Emergency Medicine | Admitting: Emergency Medicine

## 2015-06-01 ENCOUNTER — Encounter (HOSPITAL_COMMUNITY)
Admission: EM | Disposition: A | Discharge status: Home / Self Care | Payer: Self-pay | Source: Home / Self Care | Attending: Emergency Medicine

## 2015-06-01 ENCOUNTER — Emergency Department (HOSPITAL_COMMUNITY): Payer: Medicare Other

## 2015-06-01 ENCOUNTER — Emergency Department (HOSPITAL_COMMUNITY): Payer: Medicare Other | Admitting: Anesthesiology

## 2015-06-01 ENCOUNTER — Encounter (HOSPITAL_COMMUNITY): Payer: Self-pay | Admitting: *Deleted

## 2015-06-01 DIAGNOSIS — M65841 Other synovitis and tenosynovitis, right hand: Secondary | ICD-10-CM | POA: Diagnosis not present

## 2015-06-01 DIAGNOSIS — L089 Local infection of the skin and subcutaneous tissue, unspecified: Secondary | ICD-10-CM

## 2015-06-01 DIAGNOSIS — F17210 Nicotine dependence, cigarettes, uncomplicated: Secondary | ICD-10-CM | POA: Insufficient documentation

## 2015-06-01 DIAGNOSIS — I10 Essential (primary) hypertension: Secondary | ICD-10-CM | POA: Diagnosis not present

## 2015-06-01 DIAGNOSIS — M542 Cervicalgia: Secondary | ICD-10-CM | POA: Diagnosis not present

## 2015-06-01 DIAGNOSIS — M79641 Pain in right hand: Secondary | ICD-10-CM | POA: Diagnosis not present

## 2015-06-01 DIAGNOSIS — M65041 Abscess of tendon sheath, right hand: Secondary | ICD-10-CM | POA: Diagnosis not present

## 2015-06-01 DIAGNOSIS — S6991XA Unspecified injury of right wrist, hand and finger(s), initial encounter: Secondary | ICD-10-CM | POA: Diagnosis not present

## 2015-06-01 HISTORY — PX: I & D EXTREMITY: SHX5045

## 2015-06-01 LAB — CBC WITH DIFFERENTIAL/PLATELET
Basophils Absolute: 0 10*3/uL (ref 0.0–0.1)
Basophils Relative: 0 %
EOS PCT: 4 %
Eosinophils Absolute: 0.5 10*3/uL (ref 0.0–0.7)
HEMATOCRIT: 38.6 % — AB (ref 39.0–52.0)
Hemoglobin: 13.4 g/dL (ref 13.0–17.0)
LYMPHS ABS: 1.5 10*3/uL (ref 0.7–4.0)
LYMPHS PCT: 11 %
MCH: 32.9 pg (ref 26.0–34.0)
MCHC: 34.7 g/dL (ref 30.0–36.0)
MCV: 94.8 fL (ref 78.0–100.0)
MONO ABS: 1 10*3/uL (ref 0.1–1.0)
Monocytes Relative: 8 %
NEUTROS ABS: 10.5 10*3/uL — AB (ref 1.7–7.7)
Neutrophils Relative %: 77 %
Platelets: 272 10*3/uL (ref 150–400)
RBC: 4.07 MIL/uL — ABNORMAL LOW (ref 4.22–5.81)
RDW: 12.9 % (ref 11.5–15.5)
WBC: 13.5 10*3/uL — AB (ref 4.0–10.5)

## 2015-06-01 LAB — BASIC METABOLIC PANEL
ANION GAP: 11 (ref 5–15)
BUN: 10 mg/dL (ref 6–20)
CALCIUM: 9.2 mg/dL (ref 8.9–10.3)
CO2: 24 mmol/L (ref 22–32)
Chloride: 100 mmol/L — ABNORMAL LOW (ref 101–111)
Creatinine, Ser: 0.76 mg/dL (ref 0.61–1.24)
GFR calc Af Amer: >60 mL/min (ref >60)
Glucose, Bld: 95 mg/dL (ref 65–99)
POTASSIUM: 4.2 mmol/L (ref 3.5–5.1)
Sodium: 135 mmol/L (ref 135–145)

## 2015-06-01 SURGERY — IRRIGATION AND DEBRIDEMENT EXTREMITY
Anesthesia: General | Site: Finger | Laterality: Right

## 2015-06-01 MED ORDER — ROCURONIUM BROMIDE 50 MG/5ML IV SOLN
INTRAVENOUS | Status: AC
  Filled 2015-06-01: qty 1

## 2015-06-01 MED ORDER — VANCOMYCIN HCL 1000 MG IV SOLR
1000.0000 mg | INTRAVENOUS | Status: DC | PRN
  Administered 2015-06-01: 1000 mg via INTRAVENOUS

## 2015-06-01 MED ORDER — ONDANSETRON HCL 4 MG/2ML IJ SOLN
INTRAMUSCULAR | Status: AC
  Filled 2015-06-01: qty 2

## 2015-06-01 MED ORDER — SODIUM CHLORIDE 0.9 % IR SOLN
Status: DC | PRN
  Administered 2015-06-01: 1000 mL

## 2015-06-01 MED ORDER — MIDAZOLAM HCL 5 MG/5ML IJ SOLN
INTRAMUSCULAR | Status: DC | PRN
  Administered 2015-06-01: 2 mg via INTRAVENOUS

## 2015-06-01 MED ORDER — TETANUS-DIPHTH-ACELL PERTUSSIS 5-2.5-18.5 LF-MCG/0.5 IM SUSP
0.5000 mL | Freq: Once | INTRAMUSCULAR | Status: AC
  Administered 2015-06-01: 0.5 mL via INTRAMUSCULAR
  Filled 2015-06-01: qty 0.5

## 2015-06-01 MED ORDER — FENTANYL CITRATE (PF) 100 MCG/2ML IJ SOLN
INTRAMUSCULAR | Status: DC | PRN
  Administered 2015-06-01: 100 ug via INTRAVENOUS
  Administered 2015-06-01: 50 ug via INTRAVENOUS
  Administered 2015-06-01: 100 ug via INTRAVENOUS

## 2015-06-01 MED ORDER — NEOSTIGMINE METHYLSULFATE 10 MG/10ML IV SOLN
INTRAVENOUS | Status: AC
  Filled 2015-06-01: qty 1

## 2015-06-01 MED ORDER — CLINDAMYCIN PHOSPHATE 900 MG/6ML IJ SOLN
INTRAMUSCULAR | Status: AC
  Filled 2015-06-01: qty 12

## 2015-06-01 MED ORDER — ONDANSETRON HCL 4 MG/2ML IJ SOLN
INTRAMUSCULAR | Status: DC | PRN
  Administered 2015-06-01: 4 mg via INTRAVENOUS

## 2015-06-01 MED ORDER — SUCCINYLCHOLINE CHLORIDE 20 MG/ML IJ SOLN
INTRAMUSCULAR | Status: AC
  Filled 2015-06-01: qty 1

## 2015-06-01 MED ORDER — LACTATED RINGERS IV SOLN
INTRAVENOUS | Status: DC | PRN
  Administered 2015-06-01 (×2): via INTRAVENOUS

## 2015-06-01 MED ORDER — LIDOCAINE HCL (CARDIAC) 20 MG/ML IV SOLN
INTRAVENOUS | Status: DC | PRN
  Administered 2015-06-01: 75 mg via INTRAVENOUS

## 2015-06-01 MED ORDER — VANCOMYCIN HCL IN DEXTROSE 1-5 GM/200ML-% IV SOLN
INTRAVENOUS | Status: AC
  Filled 2015-06-01: qty 200

## 2015-06-01 MED ORDER — MIDAZOLAM HCL 2 MG/2ML IJ SOLN
INTRAMUSCULAR | Status: AC
  Filled 2015-06-01: qty 4

## 2015-06-01 MED ORDER — PROPOFOL 10 MG/ML IV BOLUS
INTRAVENOUS | Status: DC | PRN
  Administered 2015-06-01: 200 mg via INTRAVENOUS

## 2015-06-01 MED ORDER — BUPIVACAINE HCL (PF) 0.25 % IJ SOLN
INTRAMUSCULAR | Status: DC | PRN
  Administered 2015-06-01: 7 mL

## 2015-06-01 MED ORDER — CLINDAMYCIN PHOSPHATE 600 MG/4ML IJ SOLN
450.0000 mg | Freq: Once | INTRAMUSCULAR | Status: AC
  Administered 2015-06-01: 450 mg via INTRAMUSCULAR
  Filled 2015-06-01: qty 3

## 2015-06-01 MED ORDER — FENTANYL CITRATE (PF) 250 MCG/5ML IJ SOLN
INTRAMUSCULAR | Status: AC
  Filled 2015-06-01: qty 5

## 2015-06-01 MED ORDER — PROPOFOL 10 MG/ML IV BOLUS
INTRAVENOUS | Status: AC
  Filled 2015-06-01: qty 20

## 2015-06-01 MED ORDER — BUPIVACAINE HCL (PF) 0.25 % IJ SOLN
INTRAMUSCULAR | Status: AC
  Filled 2015-06-01: qty 30

## 2015-06-01 MED ORDER — GLYCOPYRROLATE 0.2 MG/ML IJ SOLN
INTRAMUSCULAR | Status: AC
  Filled 2015-06-01: qty 2

## 2015-06-01 SURGICAL SUPPLY — 58 items
BANDAGE COBAN STERILE 2 (GAUZE/BANDAGES/DRESSINGS) IMPLANT
BANDAGE ELASTIC 3 VELCRO ST LF (GAUZE/BANDAGES/DRESSINGS) ×2 IMPLANT
BANDAGE ELASTIC 4 VELCRO ST LF (GAUZE/BANDAGES/DRESSINGS) ×2 IMPLANT
BNDG COHESIVE 1X5 TAN STRL LF (GAUZE/BANDAGES/DRESSINGS) IMPLANT
BNDG CONFORM 2 STRL LF (GAUZE/BANDAGES/DRESSINGS) IMPLANT
BNDG ESMARK 4X9 LF (GAUZE/BANDAGES/DRESSINGS) ×2 IMPLANT
BNDG GAUZE ELAST 4 BULKY (GAUZE/BANDAGES/DRESSINGS) ×2 IMPLANT
CORDS BIPOLAR (ELECTRODE) ×2 IMPLANT
COVER SURGICAL LIGHT HANDLE (MISCELLANEOUS) ×2 IMPLANT
DECANTER SPIKE VIAL GLASS SM (MISCELLANEOUS) ×2 IMPLANT
DRAIN PENROSE 1/4X12 LTX STRL (WOUND CARE) IMPLANT
DRAPE MICROSCOPE LEICA (MISCELLANEOUS) IMPLANT
DRAPE OEC MINIVIEW 54X84 (DRAPES) IMPLANT
DRSG ADAPTIC 3X8 NADH LF (GAUZE/BANDAGES/DRESSINGS) IMPLANT
DRSG EMULSION OIL 3X3 NADH (GAUZE/BANDAGES/DRESSINGS) ×2 IMPLANT
DRSG PAD ABDOMINAL 8X10 ST (GAUZE/BANDAGES/DRESSINGS) ×4 IMPLANT
GAUZE PACKING IODOFORM 1/4X15 (GAUZE/BANDAGES/DRESSINGS) ×2 IMPLANT
GAUZE SPONGE 4X4 12PLY STRL (GAUZE/BANDAGES/DRESSINGS) ×2 IMPLANT
GAUZE XEROFORM 1X8 LF (GAUZE/BANDAGES/DRESSINGS) ×2 IMPLANT
GLOVE BIO SURGEON STRL SZ7.5 (GLOVE) ×2 IMPLANT
GLOVE BIOGEL PI IND STRL 8 (GLOVE) ×1 IMPLANT
GLOVE BIOGEL PI INDICATOR 8 (GLOVE) ×1
GOWN STRL REUS W/ TWL LRG LVL3 (GOWN DISPOSABLE) ×1 IMPLANT
GOWN STRL REUS W/TWL LRG LVL3 (GOWN DISPOSABLE) ×1
KIT BASIN OR (CUSTOM PROCEDURE TRAY) ×2 IMPLANT
KIT ROOM TURNOVER OR (KITS) ×2 IMPLANT
LOOP VESSEL MAXI BLUE (MISCELLANEOUS) IMPLANT
LOOP VESSEL MINI RED (MISCELLANEOUS) IMPLANT
MANIFOLD NEPTUNE II (INSTRUMENTS) ×2 IMPLANT
NEEDLE HYPO 25X1 1.5 SAFETY (NEEDLE) ×2 IMPLANT
NS IRRIG 1000ML POUR BTL (IV SOLUTION) ×2 IMPLANT
PACK ORTHO EXTREMITY (CUSTOM PROCEDURE TRAY) ×2 IMPLANT
PAD ARMBOARD 7.5X6 YLW CONV (MISCELLANEOUS) ×4 IMPLANT
PAD CAST 4YDX4 CTTN HI CHSV (CAST SUPPLIES) ×1 IMPLANT
PADDING CAST COTTON 4X4 STRL (CAST SUPPLIES) ×1
SCRUB BETADINE 4OZ XXX (MISCELLANEOUS) ×2 IMPLANT
SET CYSTO W/LG BORE CLAMP LF (SET/KITS/TRAYS/PACK) ×2 IMPLANT
SOLUTION BETADINE 4OZ (MISCELLANEOUS) ×2 IMPLANT
SPLINT PLASTER CAST XFAST 3X15 (CAST SUPPLIES) ×1 IMPLANT
SPLINT PLASTER XTRA FASTSET 3X (CAST SUPPLIES) ×1
SPONGE LAP 18X18 X RAY DECT (DISPOSABLE) ×2 IMPLANT
SPONGE LAP 4X18 X RAY DECT (DISPOSABLE) ×2 IMPLANT
SUCTION FRAZIER TIP 10 FR DISP (SUCTIONS) ×2 IMPLANT
SUT CHROMIC 6 0 PS 4 (SUTURE) IMPLANT
SUT ETHILON 4 0 P 3 18 (SUTURE) IMPLANT
SUT ETHILON 4 0 PS 2 18 (SUTURE) IMPLANT
SUT MERSILENE 4 0 P 3 (SUTURE) IMPLANT
SUT MON AB 5-0 P3 18 (SUTURE) IMPLANT
SUT PROLENE 3 0 PS 2 (SUTURE) IMPLANT
SYR CONTROL 10ML LL (SYRINGE) IMPLANT
TOWEL OR 17X24 6PK STRL BLUE (TOWEL DISPOSABLE) ×2 IMPLANT
TOWEL OR 17X26 10 PK STRL BLUE (TOWEL DISPOSABLE) ×2 IMPLANT
TUBE ANAEROBIC SPECIMEN COL (MISCELLANEOUS) ×2 IMPLANT
TUBE CONNECTING 12X1/4 (SUCTIONS) ×2 IMPLANT
TUBE FEEDING 5FR 15 INCH (TUBING) ×2 IMPLANT
UNDERPAD 30X30 INCONTINENT (UNDERPADS AND DIAPERS) ×2 IMPLANT
WATER STERILE IRR 1000ML POUR (IV SOLUTION) ×2 IMPLANT
YANKAUER SUCT BULB TIP NO VENT (SUCTIONS) ×2 IMPLANT

## 2015-06-01 NOTE — Brief Op Note (Signed)
06/01/2015  11:58 PM  PATIENT:  Rowe Clack  64 y.o. male  PRE-OPERATIVE DIAGNOSIS:  RIGHT LONG FINGER TENDON SHEATH INFECTION  POST-OPERATIVE DIAGNOSIS:  RIGHT LONG FINGER TENDON SHEATH INFECTION  PROCEDURE:  Procedure(s): INCISION AND DRAINAGE RIGHT LONG FINGER (Right)  SURGEON:  Surgeon(s) and Role:    * Leanora Cover, MD - Primary  PHYSICIAN ASSISTANT:   ASSISTANTS: none   ANESTHESIA:   general  EBL:  Total I/O In: 1000 [I.V.:1000] Out: -   BLOOD ADMINISTERED:none  DRAINS: iodoform packing and pediatric feeding tube drain  LOCAL MEDICATIONS USED:  MARCAINE     SPECIMEN:  Source of Specimen:  right long finger  DISPOSITION OF SPECIMEN:  micro  COUNTS:  YES  TOURNIQUET:   Total Tourniquet Time Documented: Upper Arm (Right) - 33 minutes Total: Upper Arm (Right) - 33 minutes   DICTATION: .Other Dictation: Dictation Number [id number]  PLAN OF CARE: Discharge to home after PACU  PATIENT DISPOSITION:  PACU - hemodynamically stable.

## 2015-06-01 NOTE — H&P (Signed)
Stephen Andersen is an 64 y.o. male.   Chief Complaint: right long finger infection HPI: 64 yo rhd male states he dropped starter from motor on right long finger ~ 1 week ago.  Began to have increasing pain and swelling today.  Seen at [facility] where XR taken and he was felt to have flexor tenosynovitis.  Transferred to [facility] for further care.  He reports no previous injury to right hand and no other issue at this time.  Past Medical History  Diagnosis Date  . Hypertension   . Microscopic colitis   . Neck pain   . Bone spur     History reviewed. No pertinent past surgical history.  No family history on file. Social History:  reports that he has been smoking.  He has never used smokeless tobacco. He reports that he does not drink alcohol or use illicit drugs.  Allergies: No Known Allergies   (Not in a hospital admission)  Results for orders placed or performed during the hospital encounter of 06/01/15 (from the past 48 hour(s))  CBC with Differential     Status: Abnormal   Collection Time: 06/01/15  8:30 PM  Result Value Ref Range   WBC 13.5 (H) 4.0 - 10.5 K/uL   RBC 4.07 (L) 4.22 - 5.81 MIL/uL   Hemoglobin 13.4 13.0 - 17.0 g/dL   HCT 38.6 (L) 39.0 - 52.0 %   MCV 94.8 78.0 - 100.0 fL   MCH 32.9 26.0 - 34.0 pg   MCHC 34.7 30.0 - 36.0 g/dL   RDW 12.9 11.5 - 15.5 %   Platelets 272 150 - 400 K/uL   Neutrophils Relative % 77 %   Neutro Abs 10.5 (H) 1.7 - 7.7 K/uL   Lymphocytes Relative 11 %   Lymphs Abs 1.5 0.7 - 4.0 K/uL   Monocytes Relative 8 %   Monocytes Absolute 1.0 0.1 - 1.0 K/uL   Eosinophils Relative 4 %   Eosinophils Absolute 0.5 0.0 - 0.7 K/uL   Basophils Relative 0 %   Basophils Absolute 0.0 0.0 - 0.1 K/uL    Dg Hand Complete Right  06/01/2015  CLINICAL DATA:  Dropped engine starter on hand 1 week ago with persistent pain and swelling, initial encounter EXAM: RIGHT HAND - COMPLETE 3+ VIEW COMPARISON:  None. FINDINGS: Soft tissue swelling is noted predominately in the  third digit. No acute fracture or dislocation is noted. Very mild degenerative changes in the interphalangeal joints as well as within the carpal bones are seen. IMPRESSION: Soft tissue swelling without acute bony abnormality. Electronically Signed   By: Inez Catalina M.D.   On: 06/01/2015 16:41     A comprehensive review of systems was negative.  Blood pressure 124/93, pulse 74, temperature 98.7 F (37.1 C), temperature source Oral, resp. rate 20, height 5\' 5"  (1.651 m), weight 54.432 kg (120 lb), SpO2 99 %.  General appearance: alert, cooperative and appears stated age Head: Normocephalic, without obvious abnormality, atraumatic Neck: supple, symmetrical, trachea midline Resp: clear to auscultation bilaterally Cardio: regular rate and rhythm GI: non tender Extremities: intact sensation and capillary refill all digits.  +epl/fpl/io.  right long with wound over middle phalanx.  swelling in whole digit.  ttp volarly. pain with passive extension of digit.  mild erythema. Pulses: 2+ and symmetric Skin: Skin color, texture, turgor normal. No rashes or lesions Neurologic: Grossly normal Incision/Wound: As above  Assessment/Plan Right long finger flexor tenosynovitis.  Recommend OR for incision and drainage of tendon sheath.  Risks, benefits, and alternatives of surgery were discussed and the patient agrees with the plan of care.   Stephen Andersen R 06/01/2015, 9:25 PM

## 2015-06-01 NOTE — ED Provider Notes (Signed)
By signing my name below, I, [name], attest that this documentation has been prepared under the direction and in the presence of [employer], PA-C. Electronically Signed: [name], ED Scribe. 06/01/2015. 8:20 PM.  8:11 PM HPI Comments: Stephen Andersen is a 64 y.o. male, who is right hand dominant, presents to the Emergency Department for evaluation of increased swelling, erythema and pain to right middle finger s/p  laceration to volar aspect of right, 3rd digit sustained 1 week ago. Pt reports he dropped an engine starter on his right hand while working on his vehicle 1 week ago when he sustained a laceration to his right 3rd digit which he immediately cleaned with hydrogen peroxide. He notes  increased swelling, erythema, and pain to right 3rd finger approximately 8 hours ago. The pt was evaluated PTA at [facility] where he received 450 mg clindamycin IM and Tdap injection. Dr. Fredna Dow was consulted by [facility] provider and he [facility] debridement tonight. Pt has not had anything to eat or drink since this AM. He denies fevers.   Patient seen and evaluated the bedside, the right third digit has fusiform swelling with purulent discharge from a laceration on the volar aspect of the PIP.  Case discussed with Dr. Fredna Dow who will take him to the operating room.  Filed Vitals:   06/01/15 1845  BP: 136/78  Pulse: 84  Temp: 98.7 F (37.1 C)  Resp: 16   DIAGNOSTIC STUDIES: Oxygen Saturation is 100% on RA, normal by my interpretation.    COORDINATION OF CARE: 8:15 PM Discussed treatment plan with pt at bedside and pt agreed to plan. Pt waiting on evaluation by Dr. Fredna Dow. He has not had anything to eat or drink since this morning. Pt was offered pain medication but states he does not need any medication currently.   Results for orders placed or performed during the hospital encounter of 16/10/96  Basic metabolic panel  Result Value  Ref Range   Sodium 133 (L) 135 - 145 mEq/L   Potassium 4.4 3.5 - 5.1 mEq/L   Chloride 98 96 - 112 mEq/L   CO2 29 19 - 32 mEq/L   Glucose, Bld 91 70 - 99 mg/dL   BUN 6 6 - 23 mg/dL   Creatinine, Ser 0.67 0.4 - 1.5 mg/dL   Calcium 9.2 8.4 - 10.5 mg/dL   GFR calc non Af Amer >60 >60 mL/min   GFR calc Af Amer  >60 mL/min    >60        The eGFR has been calculated using the MDRD equation. This calculation has not been validated in all clinical situations. eGFR's persistently <60 mL/min signify possible Chronic Kidney Disease.   Dg Hand Complete Right  06/01/2015  CLINICAL DATA:  Dropped engine starter on hand 1 week ago with persistent pain and swelling, initial encounter EXAM: RIGHT HAND - COMPLETE 3+ VIEW COMPARISON:  None. FINDINGS: Soft tissue swelling is noted predominately in the third digit. No acute fracture or dislocation is noted. Very mild degenerative changes in the interphalangeal joints as well as within the carpal bones are seen. IMPRESSION: Soft tissue swelling without acute bony abnormality. Electronically Signed   By: Inez Catalina M.D.   On: 06/01/2015 16:41    I personally performed the services described in this documentation, which was scribed in my presence. The recorded information has been reviewed and is accurate.    Monico Blitz, PA-C  06/01/15 2144  Dorie Rank, MD 06/01/15 2320

## 2015-06-01 NOTE — ED Provider Notes (Signed)
CSN: [id number]     Arrival date & time 06/01/15  1613 History   First MD Initiated Contact with Patient 06/01/15 1640     Chief Complaint  Patient presents with  . Finger Injury     (Consider location/radiation/quality/duration/timing/severity/associated sxs/prior Treatment) The history is provided by the patient and the spouse.   Stephen Andersen is a 64 y.o. right handed male  presenting with increased pain, swelling and decreased ability to move his right long finger since an injury sustained 1 week ago when he dropped a engine starter on his finger while working on his personal vehicle.  He has a laceration along the volar proximal middle phalanx which his significant other describes as initially "very deep".  They have been cleaning the wound daily using hydrogen peroxide and they thought it was healing well until he woke today with significantly more pain, redness and swelling of the digit.  He denies numbness in his distal fingertip.  He is able to flex and extend the finger slightly but with significant pain.  He denies radiation of pain into his hand.  The wound is much more shallow since the initial injury, but has started to drain fluid today.  He denies fevers or chills, nausea or other complaint.  He has no significant past medical history.  His tetanus status is unknown.   Past Medical History  Diagnosis Date  . Hypertension   . Microscopic colitis   . Neck pain   . Bone spur    History reviewed. No pertinent past surgical history. No family history on file. Social History  Substance Use Topics  . Smoking status: Current Every Day Smoker  . Smokeless tobacco: Never Used     Comment: 1 pack a day x 30 yrs  . Alcohol Use: No    Review of Systems  Constitutional: Negative for fever.  Musculoskeletal: Positive for joint swelling and arthralgias. Negative for myalgias.  Skin: Positive for color change and wound.  Neurological: Negative for weakness and numbness.       Allergies  Review of patient's allergies indicates no known allergies.  Home Medications   Prior to Admission medications   Medication Sig Start Date End Date Taking? Authorizing Provider  aspirin 81 MG tablet Take 81 mg by mouth daily.   Yes Historical Provider, MD  diazepam (VALIUM) 10 MG tablet Take 10 mg by mouth every 12 (twelve) hours as needed for anxiety.   Yes Historical Provider, MD  HYDROcodone-acetaminophen (NORCO) 10-325 MG tablet Take 1 tablet by mouth every 6 (six) hours as needed. FOR PAIN 04/25/15  Yes Historical Provider, MD  hydrOXYzine (ATARAX/VISTARIL) 25 MG tablet Take 25 mg by mouth at bedtime.  05/13/15  Yes Historical Provider, MD  Multiple Vitamin (MULTI VITAMIN DAILY PO) Take by mouth daily.   Yes Historical Provider, MD  NIFEDICAL XL 30 MG 24 hr tablet Take 30 mg by mouth daily. 05/13/15  Yes Historical Provider, MD  venlafaxine XR (EFFEXOR-XR) 37.5 MG 24 hr capsule Take 37.5 mg by mouth daily with breakfast.   Yes Historical Provider, MD  DELZICOL 400 MG CPDR DR capsule TAKE 2 CAPSULES BY MOUTH TWICE DAILY Patient not taking: Reported on 02/15/2015 09/15/14   Rogene Houston, MD  Mesalamine (ASACOL) 400 MG CPDR DR capsule Take 800 mg by mouth 2 (two) times daily. 10/13/13   Rogene Houston, MD   BP 136/78 mmHg  Pulse 84  Temp(Src) 98.7 F (37.1 C) (Oral)  Resp 16  Ht  5\' 5"  (1.651 m)  Wt 120 lb (54.432 kg)  BMI 19.97 kg/m2  SpO2 100% Physical Exam  Constitutional: He appears well-developed and well-nourished.  HENT:  Head: Atraumatic.  Neck: Normal range of motion.  Cardiovascular:  Pulses equal bilaterally  Musculoskeletal: He exhibits tenderness.  Neurological: He is alert. He has normal strength. He displays normal reflexes. No sensory deficit.  Skin: Skin is warm and dry. Laceration noted. There is erythema.  Right long finger is significantly edematous along the entire length of finger.  There is erythema without radiation or streaking into  his hand.  There is a shallow appearing laceration, draining purulence.  There is no red streaking or radiation of redness or pain involving his hand.  Finger is held in slight flexion.  Increased pain with active and passive range of motion.  Psychiatric: He has a normal mood and affect.    ED Course  Procedures (including critical care time) Labs Review Labs Reviewed - No data to display  Imaging Review Dg Hand Complete Right  06/01/2015  CLINICAL DATA:  Dropped engine starter on hand 1 week ago with persistent pain and swelling, initial encounter EXAM: RIGHT HAND - COMPLETE 3+ VIEW COMPARISON:  None. FINDINGS: Soft tissue swelling is noted predominately in the third digit. No acute fracture or dislocation is noted. Very mild degenerative changes in the interphalangeal joints as well as within the carpal bones are seen. IMPRESSION: Soft tissue swelling without acute bony abnormality. Electronically Signed   By: Inez Catalina M.D.   On: 06/01/2015 16:41   I have personally reviewed and evaluated these images and lab results as part of my medical decision-making.   EKG Interpretation None      MDM   Final diagnoses:  Finger infection   Patient was given clindamycin 450 mg IM.  He was also updated with his tetanus vaccine. Discussed with Dr. Fredna Dow with hand in [place].  Advises pt come to [facility] tonight for OR debridement.  Patient was advised to be nothing by mouth, stating he has had no oral intake since this morning.  His significant other will drive him to [facility] at this time.  Discussed with Dr. Tomi Bamberger in the ED of his pending arrival.  Report was also called to charge nurse by RN.    Evalee Jefferson, PA-C 06/01/15 [place], DO 06/04/15 [id number]

## 2015-06-01 NOTE — Anesthesia Procedure Notes (Signed)
Procedure Name: LMA Insertion Date/Time: 06/01/2015 11:09 PM Performed by: Hodan Wurtz S Pre-anesthesia Checklist: Patient identified, Timeout performed, Emergency Drugs available, Suction available and Patient being monitored Patient Re-evaluated:Patient Re-evaluated prior to inductionOxygen Delivery Method: Circle system utilized Preoxygenation: Pre-oxygenation with 100% oxygen Intubation Type: IV induction Ventilation: Mask ventilation without difficulty LMA: LMA inserted LMA Size: 4.0 Tube type: Oral Number of attempts: 1 Tube secured with: Tape Dental Injury: Teeth and Oropharynx as per pre-operative assessment

## 2015-06-01 NOTE — ED Notes (Signed)
Holding ready for pt.

## 2015-06-01 NOTE — ED Notes (Addendum)
Pt states a car starter dropped on his right hand middle finger last week. Finger has got increasingly more swollen in the last 2 days.

## 2015-06-01 NOTE — ED Notes (Signed)
PA at bedside.

## 2015-06-01 NOTE — Anesthesia Preprocedure Evaluation (Signed)
Anesthesia Evaluation  Patient identified by MRN, date of birth, ID band Patient awake    Reviewed: Allergy & Precautions, NPO status , Patient's Chart, lab work & pertinent test results  Airway Mallampati: I  TM Distance: >3 FB Neck ROM: Full    Dental  (+) Teeth Intact, Dental Advisory Given   Pulmonary Current Smoker,    breath sounds clear to auscultation       Cardiovascular hypertension,  Rhythm:Regular Rate:Normal     Neuro/Psych    GI/Hepatic   Endo/Other    Renal/GU      Musculoskeletal   Abdominal   Peds  Hematology   Anesthesia Other Findings   Reproductive/Obstetrics                             Anesthesia Physical Anesthesia Plan  ASA: I  Anesthesia Plan: General   Post-op Pain Management:    Induction: Intravenous  Airway Management Planned: LMA  Additional Equipment:   Intra-op Plan:   Post-operative Plan: Extubation in OR  Informed Consent: I have reviewed the patients History and Physical, chart, labs and discussed the procedure including the risks, benefits and alternatives for the proposed anesthesia with the patient or authorized representative who has indicated his/her understanding and acceptance.   Dental advisory given  Plan Discussed with:   Anesthesia Plan Comments:         Anesthesia Quick Evaluation

## 2015-06-02 ENCOUNTER — Encounter (HOSPITAL_COMMUNITY): Payer: Self-pay | Admitting: Orthopedic Surgery

## 2015-06-02 DIAGNOSIS — M65841 Other synovitis and tenosynovitis, right hand: Secondary | ICD-10-CM | POA: Diagnosis not present

## 2015-06-02 MED ORDER — HYDROCODONE-ACETAMINOPHEN 5-325 MG PO TABS: ORAL_TABLET | ORAL | Status: DC

## 2015-06-02 MED ORDER — SULFAMETHOXAZOLE-TRIMETHOPRIM 800-160 MG PO TABS: 1.0000 | ORAL_TABLET | Freq: Two times a day (BID) | ORAL | Status: DC

## 2015-06-02 MED ORDER — MEPERIDINE HCL 25 MG/ML IJ SOLN: 6.2500 mg | INTRAMUSCULAR | Status: DC | PRN

## 2015-06-02 MED ORDER — OXYCODONE HCL 5 MG PO TABS: 5.0000 mg | ORAL_TABLET | Freq: Once | ORAL | Status: DC | PRN

## 2015-06-02 MED ORDER — OXYCODONE HCL 5 MG/5ML PO SOLN: 5.0000 mg | Freq: Once | ORAL | Status: DC | PRN

## 2015-06-02 MED ORDER — HYDROMORPHONE HCL 1 MG/ML IJ SOLN: 0.2500 mg | INTRAMUSCULAR | Status: DC | PRN

## 2015-06-02 NOTE — Op Note (Signed)
[id number] 

## 2015-06-02 NOTE — Discharge Instructions (Signed)

## 2015-06-02 NOTE — Op Note (Signed)
Stephen Andersen, Stephen Andersen NO.:  [id number]  MEDICAL RECORD NO.:  [id number]  LOCATION:  [facility]                         FACILITY:  [place]  PHYSICIAN:  Leanora Cover, MD        DATE OF BIRTH:  11/26/1950  DATE OF PROCEDURE:  06/01/2015 DATE OF DISCHARGE:  06/02/2015                              OPERATIVE REPORT   PREOPERATIVE DIAGNOSIS:  Right long finger flexor sheath infection.  POSTOPERATIVE DIAGNOSIS:  Right long finger flexor sheath infection.  PROCEDURE:  Right long finger incision and drainage  for right long finger flexor sheath infection.  SURGEON:  Leanora Cover, M.d.  ASSISTANT:  None.  ANESTHESIA:  General.  IV FLUIDS:  Per anesthesia flow sheet.  ESTIMATED BLOOD LOSS:  Minimal.  COMPLICATIONS:  None.  SPECIMENS:  Cultures to micro.  TOURNIQUET TIME:  33 minutes.  DISPOSITION:  Stable to PACU.  INDICATIONS:  Stephen Andersen is a 64 year old, right-hand dominant male who states approximately a week ago starter from his motor fell onto his right long finger causing a laceration.  He had been doing well until today when it started to become more swollen and painful.  He presented to the [facility] Emergency Department where he was evaluated and felt to have a flexor sheath infection.  I was consulted for management of the condition.  He has had no fevers, chills, or night sweats.  He was transferred to [facility] for further care.  On examination, he had a painful right long finger.  He is tender volarly.  Pain with passive extension.  There was no erythema.  No proximal streaking.  He was afebrile and white count was 13.5.  Recommended incision and drainage in the operating room.  Risks, benefits alternatives of surgery were discussed including risk of blood loss; infection; damage to nerves, vessels, tendons, ligaments, bone; failure of surgery; need for additional surgery; complications with wound healing, continued pain, continued infection, need  for repeat irrigation and debridement.  He voiced understanding of these risk, elected to proceed.  OPERATIVE COURSE:  After being identified preoperatively by myself, the patient agreed upon the procedure and site of procedure.  Surgical site was marked.  The risks, benefits, and alternatives of surgery were reviewed and he wished to proceed.  Surgical consent had been signed. He was given clindamycin at [facility] Emergency Department.  Other antibiotics were held for cultures.  He was transferred to the operating room and placed on the operating table in supine position with the right upper extremity on arm board.  General anesthesia was induced by Anesthesiology.  Right upper extremity was prepped and draped in normal sterile orthopedic fashion.  Surgical pause was performed between surgeons, anesthesia, operating staff, and all were in agreement as to the patient, procedure, site procedure.  Tourniquet at the proximal aspect of the extremity was inflated to 250 mmHg after gravity exsanguination with digits and Esmarch exsanguination of the hand was performed.  The wound on the volar aspect of the long finger middle phalanx was explored.  There was gross purulence.  Cultures were taken for aerobes and anaerobes.  Milking from proximally produced purulence within the flexor sheath.  Milking from  distally did not produce any purulence.  Incision was made over the distal phalanx and over the volar aspect of the MP joint of the long finger.  These were carried into subcutaneous tissues by spreading technique.  The flexor sheath was accessed both proximally and distally.  The A1 pulley was partially released.  A #5 pediatric feeding tube was placed into the sheath from proximally.  This was copiously irrigated with approximately 150 mL of sterile saline.  The effluent was obtained from both the middle wound and the proximal wound.  The feeding tube was then removed and then advanced into  the sheath distally through both the middle wound and distal wound.  Irrigant was able to be obtained from each wound separately.  This was copiously irrigated as well.  The feeding tube drain was placed back into the sheath from the proximal wound.  Again, irrigant was placed through this in good effluent was obtained from the middle wound.  The drain was left in as a drain.  The wounds packed with quarter-inch iodoform gauze.  A digital block was performed with 7 mL of 0.25% plain Marcaine to aid in postoperative analgesia.  Wounds were dressed with sterile 4x4s, wrapped with Kerlix.  A volar splint was placed and wrapped with Kerlix and Ace bandage.  Tourniquet was deflated at 33 minutes.  Fingertips were pink with brisk capillary refill after deflation of tourniquet.  Operative drapes were broken down and the patient was awoken from anesthesia safely.  He was transferred back to stretcher and taken to PACU in stable condition.  He was given a dose of IV vancomycin after cultures had been taken.  We will see him back in the office tomorrow for wound check and removal of his drain.  We will start Bactrim DS 1 p.o. b.i.d. x10 days.     Leanora Cover, MD     KK/MEDQ  D:  06/02/2015  T:  06/02/2015  Job:  [id number]

## 2015-06-02 NOTE — Transfer of Care (Signed)
Immediate Anesthesia Transfer of Care Note  Patient: Stephen Andersen  Procedure(s) Performed: Procedure(s): INCISION AND DRAINAGE RIGHT LONG FINGER (Right)  Patient Location: PACU  Anesthesia Type:General  Level of Consciousness: awake  Airway & Oxygen Therapy: Patient Spontanous Breathing and Patient connected to face mask oxygen  Post-op Assessment: Report given to RN and Post -op Vital signs reviewed and stable  Post vital signs: Reviewed and stable  Last Vitals:  Filed Vitals:   06/02/15 0005  BP: 117/81  Pulse:   Temp: 37 C  Resp:     Complications: No apparent anesthesia complications

## 2015-06-02 NOTE — Anesthesia Postprocedure Evaluation (Signed)
  Anesthesia Post-op Note  Patient: Stephen Andersen  Procedure(s) Performed: Procedure(s): INCISION AND DRAINAGE RIGHT LONG FINGER (Right)  Patient Location: PACU  Anesthesia Type: General   Level of Consciousness: awake, alert  and oriented  Airway and Oxygen Therapy: Patient Spontanous Breathing  Post-op Pain: mild  Post-op Assessment: Post-op Vital signs reviewed  Post-op Vital Signs: Reviewed  Last Vitals:  Filed Vitals:   06/02/15 0100  BP:   Pulse:   Temp: 37.1 C  Resp:     Complications: No apparent anesthesia complications

## 2015-06-03 DIAGNOSIS — M79641 Pain in right hand: Secondary | ICD-10-CM | POA: Diagnosis not present

## 2015-06-03 DIAGNOSIS — M25641 Stiffness of right hand, not elsewhere classified: Secondary | ICD-10-CM | POA: Diagnosis not present

## 2015-06-03 DIAGNOSIS — S61401D Unspecified open wound of right hand, subsequent encounter: Secondary | ICD-10-CM | POA: Diagnosis not present

## 2015-06-04 LAB — CULTURE, ROUTINE-ABSCESS

## 2015-06-06 LAB — ANAEROBIC CULTURE

## 2015-06-07 DIAGNOSIS — M79641 Pain in right hand: Secondary | ICD-10-CM | POA: Diagnosis not present

## 2015-06-07 DIAGNOSIS — S61401D Unspecified open wound of right hand, subsequent encounter: Secondary | ICD-10-CM | POA: Diagnosis not present

## 2015-06-07 DIAGNOSIS — M25641 Stiffness of right hand, not elsewhere classified: Secondary | ICD-10-CM | POA: Diagnosis not present

## 2015-06-08 DIAGNOSIS — S61401D Unspecified open wound of right hand, subsequent encounter: Secondary | ICD-10-CM | POA: Diagnosis not present

## 2015-06-11 DIAGNOSIS — M25641 Stiffness of right hand, not elsewhere classified: Secondary | ICD-10-CM | POA: Diagnosis not present

## 2015-06-11 DIAGNOSIS — S61401D Unspecified open wound of right hand, subsequent encounter: Secondary | ICD-10-CM | POA: Diagnosis not present

## 2015-06-11 DIAGNOSIS — M6281 Muscle weakness (generalized): Secondary | ICD-10-CM | POA: Diagnosis not present

## 2015-06-14 DIAGNOSIS — M6281 Muscle weakness (generalized): Secondary | ICD-10-CM | POA: Diagnosis not present

## 2015-06-14 DIAGNOSIS — S61401D Unspecified open wound of right hand, subsequent encounter: Secondary | ICD-10-CM | POA: Diagnosis not present

## 2015-06-14 DIAGNOSIS — M25641 Stiffness of right hand, not elsewhere classified: Secondary | ICD-10-CM | POA: Diagnosis not present

## 2015-09-29 ENCOUNTER — Other Ambulatory Visit (INDEPENDENT_AMBULATORY_CARE_PROVIDER_SITE_OTHER): Payer: Self-pay | Admitting: *Deleted

## 2015-09-29 DIAGNOSIS — Z0000 Encounter for general adult medical examination without abnormal findings: Secondary | ICD-10-CM | POA: Diagnosis not present

## 2015-09-29 DIAGNOSIS — I10 Essential (primary) hypertension: Secondary | ICD-10-CM | POA: Diagnosis not present

## 2015-09-29 DIAGNOSIS — Z1389 Encounter for screening for other disorder: Secondary | ICD-10-CM | POA: Diagnosis not present

## 2015-09-29 DIAGNOSIS — Z6820 Body mass index (BMI) 20.0-20.9, adult: Secondary | ICD-10-CM | POA: Diagnosis not present

## 2015-09-29 DIAGNOSIS — Z1211 Encounter for screening for malignant neoplasm of colon: Secondary | ICD-10-CM

## 2015-09-29 DIAGNOSIS — Z125 Encounter for screening for malignant neoplasm of prostate: Secondary | ICD-10-CM | POA: Diagnosis not present

## 2015-11-01 ENCOUNTER — Encounter (INDEPENDENT_AMBULATORY_CARE_PROVIDER_SITE_OTHER): Payer: Self-pay | Admitting: *Deleted

## 2015-11-01 ENCOUNTER — Other Ambulatory Visit (INDEPENDENT_AMBULATORY_CARE_PROVIDER_SITE_OTHER): Payer: Self-pay | Admitting: *Deleted

## 2015-11-01 NOTE — Telephone Encounter (Signed)
Patient needs trilyte 

## 2015-11-02 MED ORDER — PEG 3350-KCL-NA BICARB-NACL 420 G PO SOLR: 4000.0000 mL | Freq: Once | ORAL | Status: DC

## 2015-11-22 ENCOUNTER — Telehealth (INDEPENDENT_AMBULATORY_CARE_PROVIDER_SITE_OTHER): Payer: Self-pay | Admitting: *Deleted

## 2015-11-22 NOTE — Telephone Encounter (Signed)
Referring MD/PCP: benjamin mann, [facility]   Procedure: tcs  Reason/Indication:  screening  Has patient had this procedure before?  Yes, 2006 -- epic  If so, when, by whom and where?    Is there a family history of colon cancer?  no  Who?  What age when diagnosed?    Is patient diabetic?   no      Does patient have prosthetic heart valve or mechanical valve?  no  Do you have a pacemaker?  no  Has patient ever had endocarditis? no  Has patient had joint replacement within last 12 months?  no  Does patient tend to be constipated or take laxatives? no  Does patient have a history of alcohol/drug use?  no  Is patient on Coumadin, Plavix and/or Aspirin? yes  Medications: asa 81 mg daily, nifedical xl 30 mg daily, diazepam 10 mg daily, effexor 37.5 mg daily, loracept 1 1/2 tab dailt  Allergies: nkda  Medication Adjustment: asa 2 days  Procedure date & time: 12/22/15 at 930

## 2015-11-22 NOTE — Telephone Encounter (Signed)
agree

## 2015-12-22 ENCOUNTER — Ambulatory Visit (HOSPITAL_COMMUNITY)
Admission: RE | Admit: 2015-12-22 | Discharge: 2015-12-22 | Disposition: A | Discharge status: Home / Self Care | Payer: Medicare Other | Source: Ambulatory Visit | Attending: Internal Medicine | Admitting: Internal Medicine

## 2015-12-22 ENCOUNTER — Encounter (HOSPITAL_COMMUNITY)
Admission: RE | Disposition: A | Discharge status: Home / Self Care | Payer: Self-pay | Source: Ambulatory Visit | Attending: Internal Medicine

## 2015-12-22 ENCOUNTER — Encounter (HOSPITAL_COMMUNITY): Payer: Self-pay | Admitting: *Deleted

## 2015-12-22 DIAGNOSIS — Z1211 Encounter for screening for malignant neoplasm of colon: Secondary | ICD-10-CM

## 2015-12-22 DIAGNOSIS — Z8719 Personal history of other diseases of the digestive system: Secondary | ICD-10-CM | POA: Diagnosis not present

## 2015-12-22 DIAGNOSIS — K644 Residual hemorrhoidal skin tags: Secondary | ICD-10-CM | POA: Insufficient documentation

## 2015-12-22 DIAGNOSIS — Z79899 Other long term (current) drug therapy: Secondary | ICD-10-CM | POA: Insufficient documentation

## 2015-12-22 DIAGNOSIS — K635 Polyp of colon: Secondary | ICD-10-CM | POA: Diagnosis not present

## 2015-12-22 DIAGNOSIS — I10 Essential (primary) hypertension: Secondary | ICD-10-CM | POA: Insufficient documentation

## 2015-12-22 DIAGNOSIS — K5730 Diverticulosis of large intestine without perforation or abscess without bleeding: Secondary | ICD-10-CM | POA: Diagnosis not present

## 2015-12-22 DIAGNOSIS — F17210 Nicotine dependence, cigarettes, uncomplicated: Secondary | ICD-10-CM | POA: Insufficient documentation

## 2015-12-22 DIAGNOSIS — D122 Benign neoplasm of ascending colon: Secondary | ICD-10-CM | POA: Diagnosis not present

## 2015-12-22 DIAGNOSIS — Z7982 Long term (current) use of aspirin: Secondary | ICD-10-CM | POA: Diagnosis not present

## 2015-12-22 HISTORY — PX: COLONOSCOPY: SHX5424

## 2015-12-22 SURGERY — COLONOSCOPY
Anesthesia: Moderate Sedation

## 2015-12-22 MED ORDER — MIDAZOLAM HCL 5 MG/5ML IJ SOLN
INTRAMUSCULAR | Status: AC
  Filled 2015-12-22: qty 10

## 2015-12-22 MED ORDER — MEPERIDINE HCL 50 MG/ML IJ SOLN
INTRAMUSCULAR | Status: AC
  Filled 2015-12-22: qty 1

## 2015-12-22 MED ORDER — MIDAZOLAM HCL 5 MG/5ML IJ SOLN
INTRAMUSCULAR | Status: DC | PRN
  Administered 2015-12-22 (×5): 2 mg via INTRAVENOUS

## 2015-12-22 MED ORDER — MEPERIDINE HCL 50 MG/ML IJ SOLN
INTRAMUSCULAR | Status: DC | PRN
  Administered 2015-12-22 (×4): 25 mg via INTRAVENOUS

## 2015-12-22 MED ORDER — STERILE WATER FOR IRRIGATION IR SOLN
Status: DC | PRN
  Administered 2015-12-22: 09:00:00

## 2015-12-22 MED ORDER — BENEFIBER DRINK MIX PO PACK: 4.0000 g | PACK | Freq: Every day | ORAL | Status: DC

## 2015-12-22 MED ORDER — SODIUM CHLORIDE 0.9 % IV SOLN
INTRAVENOUS | Status: DC
  Administered 2015-12-22: 09:00:00 via INTRAVENOUS

## 2015-12-22 NOTE — H&P (Signed)
Stephen Andersen is an 65 y.o. male.   Chief Complaint: Patient is here for colonoscopy. HPI: Patient is 66 year old Caucasian male was here for screening colonoscopy. He denies abdominal pain or rectal bleeding. He has occasional fecal incontinence when the stool is loose. He has history of microscopic colitis which was diagnosed back in January 2006 when he had colonoscopy. He was on mesalamine for number of years but lately he has been in remission without any medications. Family History is negative for CRC.  Past Medical History  Diagnosis Date  . Hypertension   . Microscopic colitis   . Neck pain   . Bone spur     Past Surgical History  Procedure Laterality Date  . I&d extremity Right 06/01/2015    Procedure: INCISION AND DRAINAGE RIGHT LONG FINGER;  Surgeon: Leanora Cover, MD;  Location: [facility];  Service: Orthopedics;  Laterality: Right;  . Right hand surgery    . Colonoscopy      History reviewed. No pertinent family history. Social History:  reports that he has been smoking Cigarettes.  He has a 40 pack-year smoking history. He has never used smokeless tobacco. He reports that he drinks about 7.2 oz of alcohol per week. He reports that he does not use illicit drugs.  Allergies: No Known Allergies  Medications Prior to Admission  Medication Sig Dispense Refill  . aspirin 81 MG tablet Take 81 mg by mouth daily.    . diazepam (VALIUM) 10 MG tablet Take 10 mg by mouth every 12 (twelve) hours as needed for anxiety.    Marland Kitchen HYDROcodone-acetaminophen (NORCO) 10-325 MG tablet Take 1 tablet by mouth every 6 (six) hours as needed. FOR PAIN  0  . Multiple Vitamin (MULTI VITAMIN DAILY PO) Take by mouth daily.    Marland Kitchen NIFEDICAL XL 30 MG 24 hr tablet Take 30 mg by mouth daily.  2  . polyethylene glycol-electrolytes (NULYTELY/GOLYTELY) 420 g solution Take 4,000 mLs by mouth once. 4000 mL 0  . venlafaxine XR (EFFEXOR-XR) 37.5 MG 24 hr capsule Take 37.5 mg by mouth daily with breakfast.      No  results found for this or any previous visit (from the past 48 hour(s)). No results found.  ROS  Blood pressure 152/94, pulse 60, temperature 98 F (36.7 C), temperature source Oral, resp. rate 14, height 5\' 5"  (1.651 m), weight 122 lb (55.339 kg), SpO2 100 %. Physical Exam  Constitutional:  Well-developed thin Caucasian male in NAD  HENT:  Mouth/Throat: Oropharynx is clear and moist.  Eyes: Conjunctivae are normal. No scleral icterus.  Neck: No thyromegaly present.  Cardiovascular: Normal rate, regular rhythm and normal heart sounds.   No murmur heard. Respiratory: Effort normal and breath sounds normal.  GI: Soft. He exhibits no distension and no mass. There is no tenderness.  Musculoskeletal: He exhibits no edema.  Lymphadenopathy:    He has no cervical adenopathy.  Neurological: He is alert.  Skin: Skin is warm and dry.     Assessment/Plan Average risk screening colonoscopy.  Hildred Laser, MD 12/22/2015, 9:00 AM

## 2015-12-22 NOTE — Discharge Instructions (Signed)
Resume usual medications and high fiber diet. Benefiber 4 g by mouth daily at bedtime. No driving for 24 hours. Physician will call with biopsy results.     Colonoscopy, Care After These instructions give you information on caring for yourself after your procedure. Your doctor may also give you more specific instructions. Call your doctor if you have any problems or questions after your procedure. HOME CARE  Do not drive for 24 hours.  Do not sign important papers or use machinery for 24 hours.  You may shower.  You may go back to your usual activities, but go slower for the first 24 hours.  Take rest breaks often during the first 24 hours.  Walk around or use warm packs on your belly (abdomen) if you have belly cramping or gas.  Drink enough fluids to keep your pee (urine) clear or pale yellow.  Resume your normal diet. Avoid heavy or fried foods.  Avoid drinking alcohol for 24 hours or as told by your doctor.  Only take medicines as told by your doctor. If a tissue sample (biopsy) was taken during the procedure:   Do not take aspirin or blood thinners for 7 days, or as told by your doctor.  Do not drink alcohol for 7 days, or as told by your doctor.  Eat soft foods for the first 24 hours. GET HELP IF: You still have a small amount of blood in your poop (stool) 2-3 days after the procedure. GET HELP RIGHT AWAY IF:  You have more than a small amount of blood in your poop.  You see clumps of tissue (blood clots) in your poop.  Your belly is puffy (swollen).  You feel sick to your stomach (nauseous) or throw up (vomit).  You have a fever.  You have belly pain that gets worse and medicine does not help. MAKE SURE YOU:  Understand these instructions.  Will watch your condition.  Will get help right away if you are not doing well or get worse.   This information is not intended to replace advice given to you by your health care provider. Make sure you discuss  any questions you have with your health care provider.   Document Released: 08/12/2010 Document Revised: 07/15/2013 Document Reviewed: 03/17/2013 Elsevier Interactive Patient Education [employer].    Diverticulosis Diverticulosis is the condition that develops when small pouches (diverticula) form in the wall of your colon. Your colon, or large intestine, is where water is absorbed and stool is formed. The pouches form when the inside layer of your colon pushes through weak spots in the outer layers of your colon. CAUSES  No one knows exactly what causes diverticulosis. RISK FACTORS  Being older than 86. Your risk for this condition increases with age. Diverticulosis is rare in people younger than 40 years. By age 14, almost everyone has it.  Eating a low-fiber diet.  Being frequently constipated.  Being overweight.  Not getting enough exercise.  Smoking.  Taking over-the-counter pain medicines, like aspirin and ibuprofen. SYMPTOMS  Most people with diverticulosis do not have symptoms. DIAGNOSIS  Because diverticulosis often has no symptoms, health care providers often discover the condition during an exam for other colon problems. In many cases, a health care provider will diagnose diverticulosis while using a flexible scope to examine the colon (colonoscopy). TREATMENT  If you have never developed an infection related to diverticulosis, you may not need treatment. If you have had an infection before, treatment may include:  Eating  more fruits, vegetables, and grains.  Taking a fiber supplement.  Taking a live bacteria supplement (probiotic).  Taking medicine to relax your colon. HOME CARE INSTRUCTIONS   Drink at least 6-8 glasses of water each day to prevent constipation.  Try not to strain when you have a bowel movement.  Keep all follow-up appointments. If you have had an infection before:  Increase the fiber in your diet as directed by your health care  provider or dietitian.  Take a dietary fiber supplement if your health care provider approves.  Only take medicines as directed by your health care provider. SEEK MEDICAL CARE IF:   You have abdominal pain.  You have bloating.  You have cramps.  You have not gone to the bathroom in 3 days. SEEK IMMEDIATE MEDICAL CARE IF:   Your pain gets worse.  Yourbloating becomes very bad.  You have a fever or chills, and your symptoms suddenly get worse.  You begin vomiting.  You have bowel movements that are bloody or black. MAKE SURE YOU:  Understand these instructions.  Will watch your condition.  Will get help right away if you are not doing well or get worse.   This information is not intended to replace advice given to you by your health care provider. Make sure you discuss any questions you have with your health care provider.   Document Released: 04/06/2004 Document Revised: 07/15/2013 Document Reviewed: 06/04/2013 Elsevier Interactive Patient Education 2016 Elsevier Inc.    High-Fiber Diet Fiber, also called dietary fiber, is a type of carbohydrate found in fruits, vegetables, whole grains, and beans. A high-fiber diet can have many health benefits. Your health care provider may recommend a high-fiber diet to help:  Prevent constipation. Fiber can make your bowel movements more regular.  Lower your cholesterol.  Relieve hemorrhoids, uncomplicated diverticulosis, or irritable bowel syndrome.  Prevent overeating as part of a weight-loss plan.  Prevent heart disease, type 2 diabetes, and certain cancers. WHAT IS MY PLAN? The recommended daily intake of fiber includes:  38 grams for men under age 80.  62 grams for men over age 48.  26 grams for women under age 43.  2 grams for women over age 17. You can get the recommended daily intake of dietary fiber by eating a variety of fruits, vegetables, grains, and beans. Your health care provider may also recommend a  fiber supplement if it is not possible to get enough fiber through your diet. WHAT DO I NEED TO KNOW ABOUT A HIGH-FIBER DIET?  Fiber supplements have not been widely studied for their effectiveness, so it is better to get fiber through food sources.  Always check the fiber content on thenutrition facts label of any prepackaged food. Look for foods that contain at least 5 grams of fiber per serving.  Ask your dietitian if you have questions about specific foods that are related to your condition, especially if those foods are not listed in the following section.  Increase your daily fiber consumption gradually. Increasing your intake of dietary fiber too quickly may cause bloating, cramping, or gas.  Drink plenty of water. Water helps you to digest fiber. WHAT FOODS CAN I EAT? Grains Whole-grain breads. Multigrain cereal. Oats and oatmeal. Brown rice. Barley. Bulgur wheat. Pollock. Bran muffins. Popcorn. Rye wafer crackers. Vegetables Sweet potatoes. Spinach. Kale. Artichokes. Cabbage. Broccoli. Green peas. Carrots. Squash. Fruits Berries. Pears. Apples. Oranges. Avocados. Prunes and raisins. Dried figs. Meats and Other Protein Sources Navy, kidney, pinto, and soy beans.  Split peas. Lentils. Nuts and seeds. Dairy Fiber-fortified yogurt. Beverages Fiber-fortified soy milk. Fiber-fortified orange juice. Other Fiber bars. The items listed above may not be a complete list of recommended foods or beverages. Contact your dietitian for more options. WHAT FOODS ARE NOT RECOMMENDED? Grains White bread. Pasta made with refined flour. White rice. Vegetables Fried potatoes. Canned vegetables. Well-cooked vegetables.  Fruits Fruit juice. Cooked, strained fruit. Meats and Other Protein Sources Fatty cuts of meat. Fried Sales executive or fried fish. Dairy Milk. Yogurt. Cream cheese. Sour cream. Beverages Soft drinks. Other Cakes and pastries. Butter and oils. The items listed above may not be a  complete list of foods and beverages to avoid. Contact your dietitian for more information. WHAT ARE SOME TIPS FOR INCLUDING HIGH-FIBER FOODS IN MY DIET?  Eat a wide variety of high-fiber foods.  Make sure that half of all grains consumed each day are whole grains.  Replace breads and cereals made from refined flour or white flour with whole-grain breads and cereals.  Replace white rice with brown rice, bulgur wheat, or millet.  Start the day with a breakfast that is high in fiber, such as a cereal that contains at least 5 grams of fiber per serving.  Use beans in place of meat in soups, salads, or pasta.  Eat high-fiber snacks, such as berries, raw vegetables, nuts, or popcorn.   This information is not intended to replace advice given to you by your health care provider. Make sure you discuss any questions you have with your health care provider.   Document Released: 07/10/2005 Document Revised: 07/31/2014 Document Reviewed: 12/23/2013 Elsevier Interactive Patient Education 2016 Elsevier Inc.   Colon Polyps Polyps are lumps of extra tissue growing inside the body. Polyps can grow in the large intestine (colon). Most colon polyps are noncancerous (benign). However, some colon polyps can become cancerous over time. Polyps that are larger than a pea may be harmful. To be safe, caregivers remove and test all polyps. CAUSES  Polyps form when mutations in the genes cause your cells to grow and divide even though no more tissue is needed. RISK FACTORS There are a number of risk factors that can increase your chances of getting colon polyps. They include:  Being older than 50 years.  Family history of colon polyps or colon cancer.  Long-term colon diseases, such as colitis or Crohn disease.  Being overweight.  Smoking.  Being inactive.  Drinking too much alcohol. SYMPTOMS  Most small polyps do not cause symptoms. If symptoms are present, they may include:  Blood in the stool.  The stool may look dark red or black.  Constipation or diarrhea that lasts longer than 1 week. DIAGNOSIS People often do not know they have polyps until their caregiver finds them during a regular checkup. Your caregiver can use 4 tests to check for polyps:  Digital rectal exam. The caregiver wears gloves and feels inside the rectum. This test would find polyps only in the rectum.  Barium enema. The caregiver puts a liquid called barium into your rectum before taking X-rays of your colon. Barium makes your colon look white. Polyps are dark, so they are easy to see in the X-ray pictures.  Sigmoidoscopy. A thin, flexible tube (sigmoidoscope) is placed into your rectum. The sigmoidoscope has a light and tiny camera in it. The caregiver uses the sigmoidoscope to look at the last third of your colon.  Colonoscopy. This test is like sigmoidoscopy, but the caregiver looks at the entire colon. This  is the most common method for finding and removing polyps. TREATMENT  Any polyps will be removed during a sigmoidoscopy or colonoscopy. The polyps are then tested for cancer. PREVENTION  To help lower your risk of getting more colon polyps:  Eat plenty of fruits and vegetables. Avoid eating fatty foods.  Do not smoke.  Avoid drinking alcohol.  Exercise every day.  Lose weight if recommended by your caregiver.  Eat plenty of calcium and folate. Foods that are rich in calcium include milk, cheese, and broccoli. Foods that are rich in folate include chickpeas, kidney beans, and spinach. HOME CARE INSTRUCTIONS Keep all follow-up appointments as directed by your caregiver. You may need periodic exams to check for polyps. SEEK MEDICAL CARE IF: You notice bleeding during a bowel movement.   This information is not intended to replace advice given to you by your health care provider. Make sure you discuss any questions you have with your health care provider.   Document Released: 04/05/2004 Document  Revised: 07/31/2014 Document Reviewed: 09/19/2011 Elsevier Interactive Patient Education [employer].

## 2015-12-22 NOTE — Op Note (Signed)
[facility] Patient Name: Stephen Andersen Procedure Date: 12/22/2015 8:53 AM MRN: [id number] Date of Birth: [birth year] Attending MD: Hildred Laser , MD CSN: [id number] Age: 65 Admit Type: Outpatient Procedure:                Colonoscopy Indications:              Screening for colorectal malignant neoplasm Providers:                Hildred Laser, MD, Janeece Riggers, RN, Randa Spike,                            Technician Referring MD:             Collene Mares, PA Medicines:                Meperidine 100 mg IV, Midazolam 10 mg IV Complications:            No immediate complications. Estimated Blood Loss:     Estimated blood loss was minimal. Procedure:                Pre-Anesthesia Assessment:                           - Prior to the procedure, a History and Physical                            was performed, and patient medications and                            allergies were reviewed. The patient's tolerance of                            previous anesthesia was also reviewed. The risks                            and benefits of the procedure and the sedation                            options and risks were discussed with the patient.                            All questions were answered, and informed consent                            was obtained. Prior Anticoagulants: The patient                            last took aspirin 3 days prior to the procedure.                            ASA Grade Assessment: II - A patient with mild                            systemic disease. After reviewing the risks and  benefits, the patient was deemed in satisfactory                            condition to undergo the procedure.                           After obtaining informed consent, the colonoscope                            was passed under direct vision. Throughout the                            procedure, the patient's blood pressure, pulse, and               oxygen saturations were monitored continuously. The                            [id number]) scope was introduced through                            the anus and advanced to the the cecum, identified                            by appendiceal orifice and ileocecal valve. The                            colonoscopy was performed without difficulty. The                            patient tolerated the procedure well. The quality                            of the bowel preparation was adequate to identify                            polyps. The ileocecal valve, appendiceal orifice,                            and rectum were photographed. Scope In: 9:15:01 AM Scope Out: 9:40:05 AM Total Procedure Duration: 0 hours 25 minutes 4 seconds  Findings:      A 6 mm polyp was found in the ascending colon. The polyp was       carpet-like. The polyp was removed with a cold snare. Resection and       retrieval were complete.      A few small-mouthed diverticula were found in the sigmoid colon.      External hemorrhoids were found during retroflexion. The hemorrhoids       were small. Impression:               - One 6 mm polyp in the ascending colon, removed                            with a cold snare. Resected and retrieved.                           -  Diverticulosis in the sigmoid colon.                           - External hemorrhoids. Moderate Sedation:      Moderate (conscious) sedation was administered by the endoscopy nurse       and supervised by the endoscopist. The following parameters were       monitored: oxygen saturation, heart rate, blood pressure, CO2       capnography and response to care. Total physician intraservice time was       35 minutes. Recommendation:           - Patient has a contact number available for                            emergencies. The signs and symptoms of potential                            delayed complications were discussed with the                             patient. Return to normal activities tomorrow.                            Written discharge instructions were provided to the                            patient.                           - High fiber diet today.                           - Use Benefiber 4 g by mouth bedtime [Route]                            [Frequency] [Duration].                           - Continue present medications.                           - Repeat colonoscopy for surveillance based on                            pathology results. Procedure Code(s):        --- Professional ---                           [phone], Colonoscopy, flexible; with removal of                            tumor(s), polyp(s), or other lesion(s) by snare                            technique  [id number], Moderate sedation services provided by the                            same physician or other qualified health care                            professional performing the diagnostic or                            therapeutic service that the sedation supports,                            requiring the presence of an independent trained                            observer to assist in the monitoring of the                            patient's level of consciousness and physiological                            status; initial 15 minutes of intraservice time,                            patient age 64 years or older                           [phone], Moderate sedation services; each additional                            15 minutes intraservice time Diagnosis Code(s):        --- Professional ---                           Z12.11, Encounter for screening for malignant                            neoplasm of colon                           D12.2, Benign neoplasm of ascending colon                           K64.4, Residual hemorrhoidal skin tags                           K57.30, Diverticulosis of large intestine without                             perforation or abscess without bleeding CPT copyright 2016 [employer] Association. All rights reserved. The codes documented in this report are preliminary and upon coder review may  be revised to meet current compliance requirements. Hildred Laser, MD Hildred Laser, MD 12/22/2015 9:51:55 AM This report has been signed electronically. Number of Addenda: 0

## 2015-12-27 ENCOUNTER — Encounter (HOSPITAL_COMMUNITY): Payer: Self-pay | Admitting: Internal Medicine

## 2015-12-28 DIAGNOSIS — G894 Chronic pain syndrome: Secondary | ICD-10-CM | POA: Diagnosis not present

## 2015-12-28 DIAGNOSIS — Z6820 Body mass index (BMI) 20.0-20.9, adult: Secondary | ICD-10-CM | POA: Diagnosis not present

## 2016-02-09 DIAGNOSIS — T148 Other injury of unspecified body region: Secondary | ICD-10-CM | POA: Diagnosis not present

## 2016-02-09 DIAGNOSIS — Z6820 Body mass index (BMI) 20.0-20.9, adult: Secondary | ICD-10-CM | POA: Diagnosis not present

## 2016-05-15 ENCOUNTER — Ambulatory Visit (INDEPENDENT_AMBULATORY_CARE_PROVIDER_SITE_OTHER): Payer: Medicare Other | Admitting: Otolaryngology

## 2016-05-15 DIAGNOSIS — H900 Conductive hearing loss, bilateral: Secondary | ICD-10-CM | POA: Diagnosis not present

## 2016-05-15 DIAGNOSIS — H6123 Impacted cerumen, bilateral: Secondary | ICD-10-CM | POA: Diagnosis not present

## 2016-06-08 ENCOUNTER — Ambulatory Visit (INDEPENDENT_AMBULATORY_CARE_PROVIDER_SITE_OTHER): Payer: Medicare Other | Admitting: Otolaryngology

## 2016-06-08 DIAGNOSIS — H6121 Impacted cerumen, right ear: Secondary | ICD-10-CM

## 2016-06-16 DIAGNOSIS — Z23 Encounter for immunization: Secondary | ICD-10-CM | POA: Diagnosis not present

## 2016-10-03 DIAGNOSIS — F419 Anxiety disorder, unspecified: Secondary | ICD-10-CM | POA: Diagnosis not present

## 2016-10-03 DIAGNOSIS — G894 Chronic pain syndrome: Secondary | ICD-10-CM | POA: Diagnosis not present

## 2016-10-03 DIAGNOSIS — Z681 Body mass index (BMI) 19 or less, adult: Secondary | ICD-10-CM | POA: Diagnosis not present

## 2016-10-03 DIAGNOSIS — Z1389 Encounter for screening for other disorder: Secondary | ICD-10-CM | POA: Diagnosis not present

## 2016-10-03 DIAGNOSIS — Z79899 Other long term (current) drug therapy: Secondary | ICD-10-CM | POA: Diagnosis not present

## 2016-10-03 DIAGNOSIS — J449 Chronic obstructive pulmonary disease, unspecified: Secondary | ICD-10-CM | POA: Diagnosis not present

## 2016-10-03 DIAGNOSIS — Z0000 Encounter for general adult medical examination without abnormal findings: Secondary | ICD-10-CM | POA: Diagnosis not present

## 2016-10-31 ENCOUNTER — Ambulatory Visit (INDEPENDENT_AMBULATORY_CARE_PROVIDER_SITE_OTHER): Payer: Medicare Other | Admitting: Internal Medicine

## 2016-11-02 ENCOUNTER — Ambulatory Visit (INDEPENDENT_AMBULATORY_CARE_PROVIDER_SITE_OTHER): Payer: Medicare Other | Admitting: Internal Medicine

## 2016-11-02 ENCOUNTER — Encounter (INDEPENDENT_AMBULATORY_CARE_PROVIDER_SITE_OTHER): Payer: Self-pay | Admitting: Internal Medicine

## 2016-11-02 ENCOUNTER — Encounter (INDEPENDENT_AMBULATORY_CARE_PROVIDER_SITE_OTHER): Payer: Self-pay

## 2016-11-02 VITALS — BP 150/80 | HR 60 | Temp 97.4°F | Ht 65.0 in | Wt 125.6 lb

## 2016-11-02 DIAGNOSIS — R197 Diarrhea, unspecified: Secondary | ICD-10-CM | POA: Diagnosis not present

## 2016-11-02 NOTE — Patient Instructions (Addendum)
GI pathogen. Further recommendations to follow.  Imodium twice a day.  

## 2016-11-02 NOTE — Progress Notes (Signed)
Subjective:    Patient ID: Stephen Andersen, male    DOB: 19-Feb-1951, 66 y.o.   MRN: [id number]  HPI Here to day for follow. Hx of chronic diarrhea secondary to microscopic colitis. He was last seen in July of 2016.  Diagnosed in January of 2006. Had been maintained on Delzicol 800mg  twice a day. From records d/c in December of 2015.  He tells me today he has been having some diarrhea. He is having a BM maybe up to 5 times a day. He has not been on any recent antibiotics. Stools are loose. He is not having diarrhea every day.  He does not have a fever. He is having formed stools.  This is not like the first time when he was diagnosed with Microscopic colitis.  Appetite is good. No weight loss.  Feels good. No pain.  No melena or BRRB  He received the flu vaccine this year.  No NSAIDS   12/22/2015 Colonoscopy: screening.  Impression:               - One 6 mm polyp in the ascending colon, removed                            with a cold snare. Resected and retrieved.                           - Diverticulosis in the sigmoid colon.                           - External hemorrhoids.  Polyp is sessile serrated polyp.Marland Kitchen Next colonoscopy in 5 years.   Review of Systems Past Medical History:  Diagnosis Date  . Bone spur   . Hypertension   . Microscopic colitis   . Neck pain     Past Surgical History:  Procedure Laterality Date  . Colonoscopy    . COLONOSCOPY N/A 12/22/2015   Procedure: COLONOSCOPY;  Surgeon: Rogene Houston, MD;  [facility];  Service: Endoscopy;  Laterality: N/A;  930  . I&D EXTREMITY Right 06/01/2015   Procedure: INCISION AND DRAINAGE RIGHT LONG FINGER;  Surgeon: Leanora Cover, MD;  Location: [facility];  Service: Orthopedics;  Laterality: Right;  . Right hand surgery      No Known Allergies  Current Outpatient Prescriptions on File Prior to Visit  Medication Sig Dispense Refill  . aspirin 81 MG tablet Take 81 mg by mouth daily.    . diazepam (VALIUM) 10 MG  tablet Take 10 mg by mouth every 12 (twelve) hours as needed for anxiety.    Marland Kitchen HYDROcodone-acetaminophen (NORCO) 10-325 MG tablet Take 1 tablet by mouth every 6 (six) hours as needed. FOR PAIN  0  . NIFEDICAL XL 30 MG 24 hr tablet Take 30 mg by mouth daily.  2  . venlafaxine XR (EFFEXOR-XR) 37.5 MG 24 hr capsule Take 37.5 mg by mouth daily with breakfast.    . Wheat Dextrin (BENEFIBER DRINK MIX) PACK Take 4 g by mouth at bedtime.     No current facility-administered medications on file prior to visit.        Objective:   Physical Exam Blood pressure (!) 150/80, pulse 60, temperature 97.4 F (36.3 C), height 5\' 5"  (1.651 m), weight 125 lb 9.6 oz (57 kg).  Alert and oriented. Skin warm and dry. Oral mucosa is  moist.   . Sclera anicteric, conjunctivae is pink. Thyroid not enlarged. No cervical lymphadenopathy. Lungs clear. Heart regular rate and rhythm.  Abdomen is soft. Bowel sounds are positive. No hepatomegaly. No abdominal masses felt. No tenderness.  No edema to lower extremities.  .       Assessment & Plan:  Diarrhea. Hx of microscopic colitis. GI pathogen. Imodium daily. Further recommendations to follow once I have stool sample back.

## 2016-11-07 ENCOUNTER — Encounter (INDEPENDENT_AMBULATORY_CARE_PROVIDER_SITE_OTHER): Payer: Self-pay

## 2017-02-22 DIAGNOSIS — Z1389 Encounter for screening for other disorder: Secondary | ICD-10-CM | POA: Diagnosis not present

## 2017-02-22 DIAGNOSIS — Z681 Body mass index (BMI) 19 or less, adult: Secondary | ICD-10-CM | POA: Diagnosis not present

## 2017-02-22 DIAGNOSIS — R51 Headache: Secondary | ICD-10-CM | POA: Diagnosis not present

## 2017-02-22 DIAGNOSIS — R634 Abnormal weight loss: Secondary | ICD-10-CM | POA: Diagnosis not present

## 2017-03-07 ENCOUNTER — Encounter: Payer: Self-pay | Admitting: Neurology

## 2017-03-07 ENCOUNTER — Encounter (INDEPENDENT_AMBULATORY_CARE_PROVIDER_SITE_OTHER): Payer: Self-pay

## 2017-03-07 ENCOUNTER — Ambulatory Visit (INDEPENDENT_AMBULATORY_CARE_PROVIDER_SITE_OTHER): Payer: Medicare Other | Admitting: Neurology

## 2017-03-07 DIAGNOSIS — R269 Unspecified abnormalities of gait and mobility: Secondary | ICD-10-CM | POA: Diagnosis not present

## 2017-03-07 DIAGNOSIS — G902 Horner's syndrome: Secondary | ICD-10-CM | POA: Insufficient documentation

## 2017-03-07 MED ORDER — GABAPENTIN 300 MG PO CAPS: 300.0000 mg | ORAL_CAPSULE | Freq: Three times a day (TID) | ORAL | 11 refills | Status: DC

## 2017-03-07 NOTE — Progress Notes (Signed)
PATIENT: Stephen Andersen DOB: 08-20-1950  Chief Complaint  Patient presents with  . Headache    He notes an increase in headaches since July 2018. His left eye has also started to droop since his headaches have returned. They are often associated with blurred vision and dizziness.  Pain is relieved by [facility] Powders.  Reports history of cluster headaches but has not had problems for the last seven years.  Marland Kitchen PCP    Jake Samples, PA-C     HISTORICAL  Stephen Andersen Is a 66 years old right-handed male, accompanied by his brother,seen in refer by his primary care PA Delman Cheadle J,for evaluation of increased headache, initial evaluation was on March 07 2017.  He has a history of chronic depression, hypertension  He denied previous history of headache,3 weeks ago at the end of July 2018, he woke up in the middle of the night, noticed a severe pounding headache on the left side, radiating pain to left temporal region, he has difficulty going back to sleep, next morning,he noticed blurry vision, difficulty walking, leaning towards his right side,  Symptoms has been persistent since its onset, he had a persistent moderate left side headache, skin sensitivity at left temporal region, gait abnormality, he has been taking  Frequent BC powder up to 5 times each week to control his left headache,  He also noticed left side droopy eyelid, right leg weakness, bilateral fingertips paresthesia, he has chronic neck pain  REVIEW OF SYSTEMS: Full 14 system review of systems performed and notable only for Headache, numbness, weakness, dizziness, blurred vision  ALLERGIES: No Known Allergies  HOME MEDICATIONS: Current Outpatient Prescriptions  Medication Sig Dispense Refill  . aspirin 81 MG tablet Take 81 mg by mouth daily.    . Aspirin-Salicylamide-Caffeine (BC HEADACHE POWDER PO) Take by mouth as needed.    Marland Kitchen HYDROcodone-acetaminophen (NORCO) 10-325 MG tablet Reports taking 0.5 tablet  daily for pain.  0  . NIFEDICAL XL 30 MG 24 hr tablet Take 30 mg by mouth daily.  2  . venlafaxine XR (EFFEXOR-XR) 37.5 MG 24 hr capsule Take 37.5 mg by mouth daily with breakfast.     No current facility-administered medications for this visit.     PAST MEDICAL HISTORY: Past Medical History:  Diagnosis Date  . Bone spur   . Depression   . Headache   . Hypertension   . Microscopic colitis   . Neck pain     PAST SURGICAL HISTORY: Past Surgical History:  Procedure Laterality Date  . Colonoscopy    . COLONOSCOPY N/A 12/22/2015   Procedure: COLONOSCOPY;  Surgeon: Rogene Houston, MD;  [facility];  Service: Endoscopy;  Laterality: N/A;  930  . I&D EXTREMITY Right 06/01/2015   Procedure: INCISION AND DRAINAGE RIGHT LONG FINGER;  Surgeon: Leanora Cover, MD;  Location: [place] [facility];  Service: Orthopedics;  Laterality: Right;  . Right hand surgery      FAMILY HISTORY: Family History  Problem Relation Age of Onset  . Stroke Mother   . Heart attack Father     SOCIAL HISTORY:  Social History   Social History  . Marital status: Divorced    Spouse name: N/A  . Number of children: 0  . Years of education: 12+   Occupational History  . Retired    Social History Main Topics  . Smoking status: Current Every Day Smoker    Packs/day: 1.00    Years: 40.00  Types: Cigarettes  . Smokeless tobacco: Never Used     Comment: 1 pack a day x 30 yrs  . Alcohol use Yes     Comment: 2-3 beers per week  . Drug use: No  . Sexual activity: Not on file   Other Topics Concern  . Not on file   Social History Narrative   Lives at home alone.   Right-handed.   3 cups of coffee daily.     PHYSICAL EXAM   Vitals:   03/07/17 1325  BP: (!) 164/95  Pulse: 92  Weight: 119 lb (54 kg)  Height: 5\' 5"  (1.651 m)    Not recorded      Body mass index is 19.8 kg/m.  PHYSICAL EXAMNIATION:  Gen: NAD, conversant, well nourised, obese, well groomed                       Cardiovascular: Regular rate rhythm, no peripheral edema, warm, nontender. Eyes: Conjunctivae clear without exudates or hemorrhage Neck: Supple, no carotid bruits. Pulmonary: Clear to auscultation bilaterally   NEUROLOGICAL EXAM:  MENTAL STATUS: Speech:    Speech is normal; fluent and spontaneous with normal comprehension.  Cognition:     Orientation to time, place and person     Normal recent and remote memory     Normal Attention span and concentration     Normal Language, naming, repeating,spontaneous speech     Fund of knowledge   CRANIAL NERVES: CN II: Visual fields are full to confrontation. Fundoscopic exam is normal with sharp discs and no vascular changes. Left pupil was 2 mm smaller than the right side, increased the size difference with dim  light CN III, IV, VI: extraocular movement are normal. No ptosis. CN V: Facial sensation is intact to pinprick in all 3 divisions bilaterally. Corneal responses are intact.  CN VII: Face is symmetric with normal eye closure and smile. CN VIII: Hearing is normal to rubbing fingers CN IX, X: Palate elevates symmetrically. Phonation is normal. CN XI: Head turning and shoulder shrug are intact CN XII: Tongue is midline with normal movements and no atrophy.  MOTOR: he has mild right leg proximal muscle weakness  REFLEXES: Reflexes are 2+ and symmetric at the biceps, triceps, knees, and ankles. Plantar responses are flexor.  SENSORY: Intact to light touch, pinprick, positional sensation and vibratory sensation are intact in fingers and toes.  COORDINATION: Rapid alternating movements and fine finger movements are intact. There is no dysmetria on finger-to-nose and heel-knee-shin.    GAIT/STANCE: Dragging right leg, mildly unsteady  DIAGNOSTIC DATA (LABS, IMAGING, TESTING) - I reviewed patient records, labs, notes, testing and imaging myself where available.   ASSESSMENT AND PLAN  Stephen Andersen is a 66 y.o. male    Acute  onset of left temporal headache, with left Horner's syndrome  Differentiation diagnosis includes left carotid artery dissection  Proceed with MRI of the brain  MRA of the brain and neck  Continue aspirin daily New-onset left temporal headache  Laboratory evaluation  Gabapentin 300mg  qhs   Marcial Pacas, M.D. Ph.D.  [facility] [address], [place], [place] [address] Ph: [phone] Fax: [phone]  CC: Jake Samples, PA-C

## 2017-03-08 ENCOUNTER — Telehealth: Payer: Self-pay | Admitting: Neurology

## 2017-03-08 DIAGNOSIS — E538 Deficiency of other specified B group vitamins: Secondary | ICD-10-CM

## 2017-03-08 LAB — CBC WITH DIFFERENTIAL
BASOS: 0 %
Basophils Absolute: 0 10*3/uL (ref 0.0–0.2)
EOS (ABSOLUTE): 0.8 10*3/uL — ABNORMAL HIGH (ref 0.0–0.4)
EOS: 9 %
HEMATOCRIT: 43.1 % (ref 37.5–51.0)
Hemoglobin: 15.6 g/dL (ref 13.0–17.7)
Immature Grans (Abs): 0 10*3/uL (ref 0.0–0.1)
Immature Granulocytes: 0 %
LYMPHS ABS: 2.7 10*3/uL (ref 0.7–3.1)
Lymphs: 30 %
MCH: 33 pg (ref 26.6–33.0)
MCHC: 36.2 g/dL — AB (ref 31.5–35.7)
MCV: 91 fL (ref 79–97)
MONOS ABS: 0.7 10*3/uL (ref 0.1–0.9)
Monocytes: 8 %
NEUTROS ABS: 4.7 10*3/uL (ref 1.4–7.0)
Neutrophils: 53 %
RBC: 4.73 x10E6/uL (ref 4.14–5.80)
RDW: 13.7 % (ref 12.3–15.4)
WBC: 9 10*3/uL (ref 3.4–10.8)

## 2017-03-08 LAB — C-REACTIVE PROTEIN: CRP: 0.6 mg/L (ref 0.0–4.9)

## 2017-03-08 LAB — COMPREHENSIVE METABOLIC PANEL
A/G RATIO: 1.9 (ref 1.2–2.2)
ALT: 15 IU/L (ref 0–44)
AST: 18 IU/L (ref 0–40)
Albumin: 5 g/dL — ABNORMAL HIGH (ref 3.6–4.8)
Alkaline Phosphatase: 85 IU/L (ref 39–117)
BUN/Creatinine Ratio: 15 (ref 10–24)
BUN: 11 mg/dL (ref 8–27)
Bilirubin Total: 0.5 mg/dL (ref 0.0–1.2)
CALCIUM: 10.1 mg/dL (ref 8.6–10.2)
CO2: 26 mmol/L (ref 20–29)
Chloride: 93 mmol/L — ABNORMAL LOW (ref 96–106)
Creatinine, Ser: 0.72 mg/dL — ABNORMAL LOW (ref 0.76–1.27)
GFR calc Af Amer: 114 mL/min/{1.73_m2} (ref >59)
GFR, EST NON AFRICAN AMERICAN: 99 mL/min/{1.73_m2} (ref >59)
Globulin, Total: 2.6 g/dL (ref 1.5–4.5)
Glucose: 87 mg/dL (ref 65–99)
Potassium: 4.9 mmol/L (ref 3.5–5.2)
Sodium: 134 mmol/L (ref 134–144)
Total Protein: 7.6 g/dL (ref 6.0–8.5)

## 2017-03-08 LAB — RPR: RPR Ser Ql: NONREACTIVE

## 2017-03-08 LAB — SEDIMENTATION RATE: Sed Rate: 2 mm/hr (ref 0–30)

## 2017-03-08 LAB — ANA W/REFLEX: Anti Nuclear Antibody(ANA): NEGATIVE

## 2017-03-08 LAB — HIV ANTIBODY (ROUTINE TESTING W REFLEX): HIV SCREEN 4TH GENERATION: NONREACTIVE

## 2017-03-08 LAB — VITAMIN B12: Vitamin B-12: <150 pg/mL — ABNORMAL LOW (ref 232–1245)

## 2017-03-08 NOTE — Telephone Encounter (Signed)
Please call patient, laboratory evaluation showed significantly decreased vitamin B12 less than 150  He should start vitamin B12 supplement, 1000 IM daily for one week,then once a week, then once every month,  I also ordered repeat vitamin B12, he can come in for repeat test without appointment

## 2017-03-08 NOTE — Telephone Encounter (Signed)
Stephen Andersen, if you don't mind can you please check the status with his insurance about his MRI's. Thank you for your help!

## 2017-03-12 NOTE — Telephone Encounter (Signed)
Pt returned RN's call. He can be reached at [phone]

## 2017-03-12 NOTE — Telephone Encounter (Signed)
Stephen Andersen got patient approved for MRI [facility] will contact patient to schedule.

## 2017-03-12 NOTE — Telephone Encounter (Signed)
Spoke to both patient and Doris Cheadle (friend on HIPAA).  They are aware of results - agreeable to both repeat labs and B12 injections.  The patient would like to have this completed at his PCP office because it is much closer to his home.  He would like this information sent to the attention of Shuvon Rankin, NP.  I have faxed over this information to her office at [phone].  The patient is aware to expect a call from her office to get his injections scheduled.

## 2017-03-15 ENCOUNTER — Encounter: Payer: Self-pay | Admitting: *Deleted

## 2017-03-15 DIAGNOSIS — E538 Deficiency of other specified B group vitamins: Secondary | ICD-10-CM | POA: Insufficient documentation

## 2017-03-19 MED ORDER — CYANOCOBALAMIN 1000 MCG/ML IJ SOLN: INTRAMUSCULAR | 0 refills | Status: DC

## 2017-03-19 NOTE — Telephone Encounter (Signed)
Patient's friend Doris Cheadle is calling to discuss B-12 results.

## 2017-03-19 NOTE — Telephone Encounter (Signed)
Spoke to [place] (friend on HIPAA) - she would like a prescription for his B12 injections sent to [facility].  The pharmacy will be administering his injections.  Ok, per vo by Dr. Krista Blue, to send the prescription.

## 2017-03-19 NOTE — Addendum Note (Signed)
Addended by: Desmond Lope on: 03/19/2017 04:47 PM   Modules accepted: Orders

## 2017-03-20 ENCOUNTER — Other Ambulatory Visit: Payer: Self-pay | Admitting: Neurology

## 2017-03-20 DIAGNOSIS — R799 Abnormal finding of blood chemistry, unspecified: Secondary | ICD-10-CM | POA: Diagnosis not present

## 2017-03-20 DIAGNOSIS — E538 Deficiency of other specified B group vitamins: Secondary | ICD-10-CM | POA: Diagnosis not present

## 2017-03-20 DIAGNOSIS — R269 Unspecified abnormalities of gait and mobility: Secondary | ICD-10-CM | POA: Diagnosis not present

## 2017-03-22 LAB — VITAMIN B12

## 2017-03-22 LAB — HOMOCYSTEINE: Homocysteine: 17.4 umol/L — ABNORMAL HIGH (ref 0.0–15.0)

## 2017-03-22 LAB — METHYLMALONIC ACID, SERUM: Methylmalonic Acid: 1745 nmol/L — ABNORMAL HIGH (ref 0–378)

## 2017-03-22 LAB — FOLATE: Folate: 11.4 ng/mL (ref >3.0)

## 2017-03-23 ENCOUNTER — Ambulatory Visit
Admission: RE | Admit: 2017-03-23 | Discharge: 2017-03-23 | Disposition: A | Discharge status: Home / Self Care | Payer: Medicare Other | Source: Ambulatory Visit | Attending: Neurology | Admitting: Neurology

## 2017-03-23 DIAGNOSIS — G902 Horner's syndrome: Secondary | ICD-10-CM

## 2017-03-23 DIAGNOSIS — R269 Unspecified abnormalities of gait and mobility: Secondary | ICD-10-CM

## 2017-03-23 MED ORDER — GADOBENATE DIMEGLUMINE 529 MG/ML IV SOLN
15.0000 mL | Freq: Once | INTRAVENOUS | Status: AC | PRN
  Administered 2017-03-23: 11 mL via INTRAVENOUS

## 2017-03-27 ENCOUNTER — Telehealth: Payer: Self-pay | Admitting: *Deleted

## 2017-03-27 NOTE — Telephone Encounter (Signed)
Left message for a return call

## 2017-03-27 NOTE — Telephone Encounter (Signed)
-----   Message from Marcial Pacas, MD sent at 03/23/2017  2:51 PM EDT ----- Please call pt: MRI of the brain showed mild age-related changes, MRA of the brain and neck showed no significant large vessel disease.

## 2017-03-28 NOTE — Telephone Encounter (Signed)
Left message for a return call

## 2017-03-28 NOTE — Telephone Encounter (Signed)
Spoke to patient - he is aware of results and verbalized understanding.   

## 2017-03-28 NOTE — Telephone Encounter (Signed)
Patient called office returning RN's call.  Please call °

## 2017-04-12 ENCOUNTER — Encounter: Payer: Self-pay | Admitting: Neurology

## 2017-04-12 ENCOUNTER — Ambulatory Visit (INDEPENDENT_AMBULATORY_CARE_PROVIDER_SITE_OTHER): Payer: Medicare HMO | Admitting: Neurology

## 2017-04-12 VITALS — BP 158/96 | HR 80 | Ht 65.0 in | Wt 123.5 lb

## 2017-04-12 DIAGNOSIS — E538 Deficiency of other specified B group vitamins: Secondary | ICD-10-CM | POA: Diagnosis not present

## 2017-04-12 DIAGNOSIS — R269 Unspecified abnormalities of gait and mobility: Secondary | ICD-10-CM | POA: Diagnosis not present

## 2017-04-12 DIAGNOSIS — M542 Cervicalgia: Secondary | ICD-10-CM | POA: Diagnosis not present

## 2017-04-12 DIAGNOSIS — H02402 Unspecified ptosis of left eyelid: Secondary | ICD-10-CM | POA: Diagnosis not present

## 2017-04-12 DIAGNOSIS — G8929 Other chronic pain: Secondary | ICD-10-CM | POA: Diagnosis not present

## 2017-04-12 MED ORDER — CYANOCOBALAMIN 1000 MCG/ML IJ SOLN: 100.0000 ug | Freq: Once | INTRAMUSCULAR | 11 refills | Status: AC

## 2017-04-12 NOTE — Progress Notes (Signed)
PATIENT: Stephen Andersen DOB: 1951/03/17  Chief Complaint  Patient presents with  . Headache    He is here with his brother, [name].  They would like to discuss his MRI and MRA results. He is currently taking gabapentin 300mg  at bedtime which has improved his headache frequency.     HISTORICAL  Stephen Andersen Is a 66 years old right-handed male, accompanied by his brother [name], seen in refer by his primary care PA Delman Cheadle J,for evaluation of increased headache, initial evaluation was on March 07 2017.  He has a history of chronic depression, hypertension  He denied previous history of headache,3 weeks ago at the end of July 2018, he woke up in the middle of the night, noticed a severe pounding headache on the left side, radiating pain to left temporal region, he has difficulty going back to sleep, next morning,he noticed blurry vision, difficulty walking, leaning towards his right side,  Symptoms has been persistent since its onset, he had a persistent moderate left side headache, skin sensitivity at left temporal region, gait abnormality, he has been taking  Frequent BC powder up to 5 times each week to control his left headache,  He also noticed left side droopy eyelid, right leg weakness, bilateral fingertips paresthesia, he has chronic neck pain  UPDATE Sept 20 2018: We have personally reviewed MRI of the brain, supratentorium small vessel disease, generalized atrophy no acute abnormality  MRA of the brain and neck showed no significant large vessel disease  Laboratory evaluation showed vitamin B12 deficiency, with level less than 150, elevated methylmalonic acid level, he is on vitamin B12 intramuscular supplement, reported mild improvement, but continue complains of generalized fatigue, he continues to have left ptosis,  REVIEW OF SYSTEMS: Full 14 system review of systems performed and notable only for dizziness, headache, weakness, blurry vision, neck pain, ear  discharge  ALLERGIES: No Known Allergies  HOME MEDICATIONS: Current Outpatient Prescriptions  Medication Sig Dispense Refill  . aspirin 81 MG tablet Take 81 mg by mouth daily.    . Aspirin-Salicylamide-Caffeine (BC HEADACHE POWDER PO) Take by mouth as needed.    . cyanocobalamin (,VITAMIN B-12,) 1000 MCG/ML injection Inject 1000 mcg daily x one week, then 1000 mcg weekly x one month, then 1000 mcg monthly x one year. 23 mL 0  . gabapentin (NEURONTIN) 300 MG capsule Take 1 capsule (300 mg total) by mouth 3 (three) times daily. 30 capsule 11  . NIFEDICAL XL 30 MG 24 hr tablet Take 30 mg by mouth daily.  2  . venlafaxine XR (EFFEXOR-XR) 37.5 MG 24 hr capsule Take 37.5 mg by mouth daily with breakfast.     No current facility-administered medications for this visit.     PAST MEDICAL HISTORY: Past Medical History:  Diagnosis Date  . Bone spur   . Depression   . Headache   . Hypertension   . Microscopic colitis   . Neck pain     PAST SURGICAL HISTORY: Past Surgical History:  Procedure Laterality Date  . Colonoscopy    . COLONOSCOPY N/A 12/22/2015   Procedure: COLONOSCOPY;  Surgeon: Rogene Houston, MD;  [facility];  Service: Endoscopy;  Laterality: N/A;  930  . I&D EXTREMITY Right 06/01/2015   Procedure: INCISION AND DRAINAGE RIGHT LONG FINGER;  Surgeon: Leanora Cover, MD;  Location: [facility];  Service: Orthopedics;  Laterality: Right;  . Right hand surgery      FAMILY HISTORY: Family History  Problem Relation  Age of Onset  . Stroke Mother   . Heart attack Father     SOCIAL HISTORY:  Social History   Social History  . Marital status: Divorced    Spouse name: N/A  . Number of children: 0  . Years of education: 12+   Occupational History  . Retired    Social History Main Topics  . Smoking status: Current Every Day Smoker    Packs/day: 1.00    Years: 40.00    Types: Cigarettes  . Smokeless tobacco: Never Used     Comment: 1 pack a day x 30 yrs  .  Alcohol use Yes     Comment: 2-3 beers per week  . Drug use: No  . Sexual activity: Not on file   Other Topics Concern  . Not on file   Social History Narrative   Lives at home alone.   Right-handed.   3 cups of coffee daily.     PHYSICAL EXAM   Vitals:   04/12/17 1302  BP: (!) 158/96  Pulse: 80  Weight: 123 lb 8 oz (56 kg)  Height: 5\' 5"  (1.651 m)    Not recorded      Body mass index is 20.55 kg/m.  PHYSICAL EXAMNIATION:  Gen: NAD, conversant, well nourised, obese, well groomed                     Cardiovascular: Regular rate rhythm, no peripheral edema, warm, nontender. Eyes: Conjunctivae clear without exudates or hemorrhage Neck: Supple, no carotid bruits. Pulmonary: Clear to auscultation bilaterally   NEUROLOGICAL EXAM:  MENTAL STATUS: Speech:    Speech is normal; fluent and spontaneous with normal comprehension.  Cognition:     Orientation to time, place and person     Normal recent and remote memory     Normal Attention span and concentration     Normal Language, naming, repeating,spontaneous speech     Fund of knowledge   CRANIAL NERVES: CN II: Visual fields are full to confrontation. Fundoscopic exam is normal with sharp discs and no vascular changes.  CN III, IV, VI: extraocular movement are normal.Mild left ptosis  CN V: Facial sensation is intact to pinprick in all 3 divisions bilaterally. Corneal responses are intact.  CN VII: Face is symmetric with normal eye closure and smile. CN VIII: Hearing is normal to rubbing fingers CN IX, X: Palate elevates symmetrically. Phonation is normal. CN XI: Head turning and shoulder shrug are intact CN XII: Tongue is midline with normal movements and no atrophy.  MOTOR: There was no significant muscle weakness notice  REFLEXES: Reflexes are 2+ and symmetric at the biceps, triceps, knees, and ankles. Plantar responses are flexor.  SENSORY: Intact to light touch, pinprick, positional sensation and  vibratory sensation are intact in fingers and toes.  COORDINATION: Rapid alternating movements and fine finger movements are intact. There is no dysmetria on finger-to-nose and heel-knee-shin.    GAIT/STANCE: Multiple effort in getting up from seated position arm crossed,  DIAGNOSTIC DATA (LABS, IMAGING, TESTING) - I reviewed patient records, labs, notes, testing and imaging myself where available.   ASSESSMENT AND PLAN  IZAIA SAY is a 66 y.o. male   Left ptosis generalized fatigue and weakness  Myasthenia gravis panel to rule out neuromuscular junctional disorder  Vitamin B12 deficiency  Continue vitamin B12 intramuscular supplement  Marcial Pacas, M.D. Ph.D.  [facility] [address], [place], [place] [address] Ph: [phone] Fax: [phone]  CC: Jake Samples, PA-C

## 2017-04-18 LAB — MYASTHENIA GRAVIS FULL PANEL
ACETYLCHOL BLOCK AB: 16 % (ref 0–25)
ANTI-STRIATION ABS: NEGATIVE
Acetylcholine Modulat Ab: <12 % (ref 0–20)

## 2017-04-18 LAB — CK: CK TOTAL: 42 U/L (ref 24–204)

## 2017-04-26 DIAGNOSIS — Z6820 Body mass index (BMI) 20.0-20.9, adult: Secondary | ICD-10-CM | POA: Diagnosis not present

## 2017-04-26 DIAGNOSIS — G902 Horner's syndrome: Secondary | ICD-10-CM | POA: Diagnosis not present

## 2017-04-26 DIAGNOSIS — E538 Deficiency of other specified B group vitamins: Secondary | ICD-10-CM | POA: Diagnosis not present

## 2017-04-26 DIAGNOSIS — Z23 Encounter for immunization: Secondary | ICD-10-CM | POA: Diagnosis not present

## 2017-04-26 DIAGNOSIS — M5030 Other cervical disc degeneration, unspecified cervical region: Secondary | ICD-10-CM | POA: Diagnosis not present

## 2017-04-27 ENCOUNTER — Ambulatory Visit
Admission: RE | Admit: 2017-04-27 | Discharge: 2017-04-27 | Disposition: A | Discharge status: Home / Self Care | Payer: Medicare HMO | Source: Ambulatory Visit | Attending: Neurology | Admitting: Neurology

## 2017-04-27 DIAGNOSIS — M542 Cervicalgia: Secondary | ICD-10-CM

## 2017-04-27 DIAGNOSIS — M4802 Spinal stenosis, cervical region: Secondary | ICD-10-CM | POA: Diagnosis not present

## 2017-04-30 ENCOUNTER — Telehealth: Payer: Self-pay | Admitting: Neurology

## 2017-04-30 NOTE — Telephone Encounter (Signed)
Please call patient, MRI cervical spine showed multilevel degenerative changes, with variable degree of foraminal stenosis at different levels, with no evidence of spinal cord compression   IMPRESSION:  This MRI of the cervical spine shows multilevel degenerative changes as detailed above. The most significant findings are: 1.    At C4-C5, there is mild spinal stenosis and mild bilateral foraminal narrowing but no nerve root compression. 2.    At C5-C6, there is mild spinal stenosis and moderately severe right and mild left foraminal narrowing. There is potential for right C6 nerve root compression. 3.    At C6-C7, there is a left paramedian disc protrusion and uncovertebral spurring causing moderate left foraminal narrowing that encroaches upon the left C7 nerve root without causing definite compression. 4.    The spinal cord appears normal.

## 2017-04-30 NOTE — Telephone Encounter (Signed)
Spoke to patient and his friend on HIPAA, Doris Cheadle.  They are both aware of the MRI results.  He will keep his pending appointment for further discussion.

## 2017-05-15 DIAGNOSIS — Z23 Encounter for immunization: Secondary | ICD-10-CM | POA: Diagnosis not present

## 2017-07-09 ENCOUNTER — Encounter (INDEPENDENT_AMBULATORY_CARE_PROVIDER_SITE_OTHER): Payer: Self-pay

## 2017-07-09 ENCOUNTER — Encounter: Payer: Self-pay | Admitting: Neurology

## 2017-07-09 ENCOUNTER — Ambulatory Visit: Payer: Medicare HMO | Admitting: Neurology

## 2017-07-09 VITALS — BP 156/94 | HR 105 | Ht 65.0 in | Wt 130.0 lb

## 2017-07-09 DIAGNOSIS — R269 Unspecified abnormalities of gait and mobility: Secondary | ICD-10-CM

## 2017-07-09 DIAGNOSIS — E538 Deficiency of other specified B group vitamins: Secondary | ICD-10-CM

## 2017-07-09 MED ORDER — CYANOCOBALAMIN 1000 MCG/ML IJ SOLN: 1000.0000 ug | INTRAMUSCULAR | 4 refills | Status: AC

## 2017-07-09 NOTE — Progress Notes (Signed)
PATIENT: Stephen Andersen DOB: [birth year]  Chief Complaint  Patient presents with  . Migraine    He is here with his brother, [name].  Feels gabapentin has been helpful for his headaches.  . Neck Pain    He would like to review his MRI results.  . Ptosis    His left eye ptosis is still bothersome.     HISTORICAL  Stephen Andersen Is a 66 years old right-handed male, accompanied by his brother [name], seen in refer by his primary care PA Delman Cheadle J,for evaluation of increased headache, initial evaluation was on March 07 2017.  He has a history of chronic depression, hypertension  He denied previous history of headache,3 weeks ago at the end of July 2018, he woke up in the middle of the night, noticed a severe pounding headache on the left side, radiating pain to left temporal region, he has difficulty going back to sleep, next morning,he noticed blurry vision, difficulty walking, leaning towards his right side,  Symptoms has been persistent since its onset, he had a persistent moderate left side headache, skin sensitivity at left temporal region, gait abnormality, he has been taking  Frequent BC powder up to 5 times each week to control his left headache,  He also noticed left side droopy eyelid, right leg weakness, bilateral fingertips paresthesia, he has chronic neck pain  UPDATE Sept 20 2018: We have personally reviewed MRI of the brain, supratentorium small vessel disease, generalized atrophy no acute abnormality  MRA of the brain and neck showed no significant large vessel disease  Laboratory evaluation showed vitamin B12 deficiency, with level less than 150, elevated methylmalonic acid level, he is on vitamin B12 intramuscular supplement, reported mild improvement, but continue complains of generalized fatigue, he continues to have left ptosis,  UPDATE Jul 09 2017: Laboratory evaluation showed negative myasthenia gravis panel, Vit B12 deficiency, elevated  homocystein level, methylmalonic level, folic acid, negative RPR, CRP, ANA, HIV,   He is getting B12 im shot, you feel much better, he complains of mild left eye lid droopy, blurry vision, last opthalmologic visit was in 2017  We have reviewed MRI cervical spine, mild degenerative changes, no evidence of cord and nerve root compression  REVIEW OF SYSTEMS: Full 14 system review of systems performed and notable only for  Blurred vision, chest tightness, neck pan,   ALLERGIES: No Known Allergies  HOME MEDICATIONS: Current Outpatient Medications  Medication Sig Dispense Refill  . aspirin 81 MG tablet Take 81 mg by mouth daily.    . Aspirin-Salicylamide-Caffeine (BC HEADACHE POWDER PO) Take by mouth as needed.    . gabapentin (NEURONTIN) 300 MG capsule Take 1 capsule (300 mg total) by mouth 3 (three) times daily. 30 capsule 11  . NIFEDICAL XL 30 MG 24 hr tablet Take 30 mg by mouth daily.  2  . venlafaxine XR (EFFEXOR-XR) 37.5 MG 24 hr capsule Take 37.5 mg by mouth daily with breakfast.     No current facility-administered medications for this visit.     PAST MEDICAL HISTORY: Past Medical History:  Diagnosis Date  . Bone spur   . Depression   . Headache   . Hypertension   . Microscopic colitis   . Neck pain     PAST SURGICAL HISTORY: Past Surgical History:  Procedure Laterality Date  . Colonoscopy    . COLONOSCOPY N/A 12/22/2015   Procedure: COLONOSCOPY;  Surgeon: Rogene Houston, MD;  [facility];  Service: Endoscopy;  Laterality: N/A;  930  . I&D EXTREMITY Right 06/01/2015   Procedure: INCISION AND DRAINAGE RIGHT LONG FINGER;  Surgeon: Leanora Cover, MD;  Location: [facility];  Service: Orthopedics;  Laterality: Right;  . Right hand surgery      FAMILY HISTORY: Family History  Problem Relation Age of Onset  . Stroke Mother   . Heart attack Father     SOCIAL HISTORY:  Social History   Socioeconomic History  . Marital status: Divorced    Spouse name: Not on file   . Number of children: 0  . Years of education: 12+  . Highest education level: Not on file  Social Needs  . Financial resource strain: Not on file  . Food insecurity - worry: Not on file  . Food insecurity - inability: Not on file  . Transportation needs - medical: Not on file  . Transportation needs - non-medical: Not on file  Occupational History  . Occupation: Retired  Tobacco Use  . Smoking status: Current Every Day Smoker    Packs/day: 1.00    Years: 40.00    Pack years: 40.00    Types: Cigarettes  . Smokeless tobacco: Never Used  . Tobacco comment: 1 pack a day x 30 yrs  Substance and Sexual Activity  . Alcohol use: Yes    Comment: 2-3 beers per week  . Drug use: No  . Sexual activity: Not on file  Other Topics Concern  . Not on file  Social History Narrative   Lives at home alone.   Right-handed.   3 cups of coffee daily.     PHYSICAL EXAM   Vitals:   07/09/17 1445  BP: (!) 156/94  Pulse: (!) 105  Weight: 130 lb (59 kg)  Height: 5\' 5"  (1.651 m)    Not recorded      Body mass index is 21.63 kg/m.  PHYSICAL EXAMNIATION:  Gen: NAD, conversant, well nourised, obese, well groomed                     Cardiovascular: Regular rate rhythm, no peripheral edema, warm, nontender. Eyes: Conjunctivae clear without exudates or hemorrhage Neck: Supple, no carotid bruits. Pulmonary: Clear to auscultation bilaterally   NEUROLOGICAL EXAM:  MENTAL STATUS: Speech:    Speech is normal; fluent and spontaneous with normal comprehension.  Cognition:     Orientation to time, place and person     Normal recent and remote memory     Normal Attention span and concentration     Normal Language, naming, repeating,spontaneous speech     Fund of knowledge   CRANIAL NERVES: CN II: Visual fields are full to confrontation. Fundoscopic exam is normal with sharp discs and no vascular changes.  CN III, IV, VI: extraocular movement are normal.Mild left ptosis  CN V: Facial  sensation is intact to pinprick in all 3 divisions bilaterally. Corneal responses are intact.  CN VII: Face is symmetric with normal eye closure and smile. CN VIII: Hearing is normal to rubbing fingers CN IX, X: Palate elevates symmetrically. Phonation is normal. CN XI: Head turning and shoulder shrug are intact CN XII: Tongue is midline with normal movements and no atrophy.  MOTOR: There was no significant muscle weakness notice  REFLEXES: Reflexes are 2+ and symmetric at the biceps, triceps, knees, and ankles. Plantar responses are flexor.  SENSORY: Intact to light touch, pinprick, positional sensation and vibratory sensation are intact in fingers and toes.  COORDINATION: Rapid alternating movements and fine  finger movements are intact. There is no dysmetria on finger-to-nose and heel-knee-shin.    GAIT/STANCE: Multiple effort in getting up from seated position arm crossed,  DIAGNOSTIC DATA (LABS, IMAGING, TESTING) - I reviewed patient records, labs, notes, testing and imaging myself where available.   ASSESSMENT AND PLAN  Stephen Andersen is a 66 y.o. male   Left ptosis generalized fatigue and weakness  No evidence of neuromuscular junctional disorder  MRI brain and cervical spine showed no significant abnormalities.  Elevated blood pressure, document bp, continue follow up with primary care physician  Vitamin B12 deficiency  Continue vitamin B12 intramuscular supplement  Marcial Pacas, M.D. Ph.D.  [facility] [address] [place], [place] [address] Ph: [phone] Fax: [phone]  CC: Jake Samples, PA-C

## 2017-07-09 NOTE — Addendum Note (Signed)
Addended by: Marcial Pacas on: 07/09/2017 03:35 PM   Modules accepted: Level of Service

## 2017-07-18 ENCOUNTER — Telehealth (INDEPENDENT_AMBULATORY_CARE_PROVIDER_SITE_OTHER): Payer: Self-pay | Admitting: Internal Medicine

## 2017-07-18 NOTE — Telephone Encounter (Signed)
Patient called, requested an appointment with Dr. Laural Golden.  I offered him an appointment with Deberah Castle, NP.  He stated that he wanted to see Dr. Laural Golden.  I asked him if he had seen his primary doctor and he stated that this has been ongoing for the last 10 years and he sees Dr. Laural Golden.  I then explained that I didn't have anything with Dr. Laural Golden for a while, but again that I'd be happy to get him in with Terri right away.  He continued to say he wanted to see Dr. Laural Golden and at that point I explained that Terri could do everything Dr. Laural Golden does here in the office and if something was needed such as a procedure, that Dr. Laural Golden would be the one to do it.  He stated that he knows how it works.  I tried everything and he still wanted Dr. Laural Golden and I then told him it would be June before I could get him in with Dr. Laural Golden.  He then told me to schedule him next week with Terri.  He was given an appointment for 07/26/17 at 8:30am with Deberah Castle, NP.

## 2017-07-26 ENCOUNTER — Encounter (INDEPENDENT_AMBULATORY_CARE_PROVIDER_SITE_OTHER): Payer: Self-pay | Admitting: Internal Medicine

## 2017-07-26 ENCOUNTER — Ambulatory Visit (INDEPENDENT_AMBULATORY_CARE_PROVIDER_SITE_OTHER): Payer: Medicare HMO | Admitting: Internal Medicine

## 2017-07-26 VITALS — BP 160/90 | HR 60 | Temp 98.1°F | Ht 65.0 in | Wt 128.0 lb

## 2017-07-26 DIAGNOSIS — K52832 Lymphocytic colitis: Secondary | ICD-10-CM | POA: Diagnosis not present

## 2017-07-26 DIAGNOSIS — R151 Fecal smearing: Secondary | ICD-10-CM

## 2017-07-26 NOTE — Progress Notes (Signed)
Patient ID: Stephen Andersen, male   DOB: [birth year], 67 y.o.   MRN: [id number]

## 2017-07-26 NOTE — Patient Instructions (Addendum)
Fiber 4 gm daily.  OV in 1 year.

## 2017-07-26 NOTE — Progress Notes (Signed)
   Subjective:    Patient ID: Stephen Andersen, male    DOB: April 10, 1951, 67 y.o.   MRN: [id number]  HPI Presents today with c/o fecal seepage. Last seen in April of this year. Hx of chronic diarrhea secondary to microscopic colitis.  Diagnosed in January of 2006.  Presents today with c/o fecal seepage. He is not having any diarrhea. He has the seepage occasionally. He has the seepage when he is voiding.  He says since he made the appt he has not had any  Fecal incontinence.   He says he is having a BM every morning and stools are formed.  States he is on B12 shots now from a low B12 level.he says his balance was off and his left eye drooped.  His appetite is good. He has gained 3 pounds since his last OV. Denies any melena or BRRB   12/22/2015 Colonoscopy: screening.  Impression: - One 6 mm polyp in the ascending colon, removed  with a cold snare. Resected and retrieved. - Diverticulosis in the sigmoid colon. - External hemorrhoids.  Polyp is sessile serrated polyp.Marland Kitchen Next colonoscopy in 5 years.   Review of Systems Past Medical History:  Diagnosis Date  . Bone spur   . Depression   . Headache   . Hypertension   . Microscopic colitis   . Neck pain     Past Surgical History:  Procedure Laterality Date  . Colonoscopy    . COLONOSCOPY N/A 12/22/2015   Procedure: COLONOSCOPY;  Surgeon: Rogene Houston, MD;  [facility];  Service: Endoscopy;  Laterality: N/A;  930  . I&D EXTREMITY Right 06/01/2015   Procedure: INCISION AND DRAINAGE RIGHT LONG FINGER;  Surgeon: Leanora Cover, MD;  Location: [facility];  Service: Orthopedics;  Laterality: Right;  . Right hand surgery      No Known Allergies  Current Outpatient Medications on File Prior to Visit  Medication Sig Dispense Refill  . aspirin 81 MG tablet Take 81 mg by mouth daily.    . cyanocobalamin (,VITAMIN B-12,) 1000 MCG/ML injection  Inject 1 mL (1,000 mcg total) into the muscle every 30 (thirty) days. 3 mL 4  . gabapentin (NEURONTIN) 300 MG capsule Take 1 capsule (300 mg total) by mouth 3 (three) times daily. 30 capsule 11  . NIFEDICAL XL 30 MG 24 hr tablet Take 30 mg by mouth daily.  2  . venlafaxine XR (EFFEXOR-XR) 37.5 MG 24 hr capsule Take 37.5 mg by mouth daily with breakfast.    . Aspirin-Salicylamide-Caffeine (BC HEADACHE POWDER PO) Take by mouth as needed.     No current facility-administered medications on file prior to visit.         Objective:   Physical Exam . Blood pressure (!) 160/90, pulse 60, temperature 98.1 F (36.7 C), height 5\' 5"  (1.651 m), weight 128 lb (58.1 kg). Alert and oriented. Skin warm and dry. Oral mucosa is moist.   . Sclera anicteric, conjunctivae is pink. Thyroid not enlarged. No cervical lymphadenopathy. Lungs clear. Heart regular rate and rhythm.  Abdomen is soft. Bowel sounds are positive. No hepatomegaly. No abdominal masses felt. No tenderness.  No edema to lower extremities.          Assessment & Plan:  Fecal incontinence. States has not had a recent episode recently.  May increase fiber.  Fiber 4 gms po.

## 2017-09-05 DIAGNOSIS — G902 Horner's syndrome: Secondary | ICD-10-CM | POA: Diagnosis not present

## 2017-09-06 ENCOUNTER — Other Ambulatory Visit (HOSPITAL_COMMUNITY): Payer: Self-pay | Admitting: Optometry

## 2017-09-06 DIAGNOSIS — G902 Horner's syndrome: Secondary | ICD-10-CM

## 2017-09-19 ENCOUNTER — Other Ambulatory Visit: Payer: Medicare HMO

## 2017-09-27 ENCOUNTER — Ambulatory Visit
Admission: RE | Admit: 2017-09-27 | Discharge: 2017-09-27 | Disposition: A | Discharge status: Home / Self Care | Payer: Medicare HMO | Source: Ambulatory Visit | Attending: Optometry | Admitting: Optometry

## 2017-09-27 DIAGNOSIS — G902 Horner's syndrome: Secondary | ICD-10-CM

## 2017-09-27 DIAGNOSIS — R911 Solitary pulmonary nodule: Secondary | ICD-10-CM | POA: Diagnosis not present

## 2017-09-27 MED ORDER — IOPAMIDOL (ISOVUE-300) INJECTION 61%
75.0000 mL | Freq: Once | INTRAVENOUS | Status: AC | PRN
  Administered 2017-09-27: 75 mL via INTRAVENOUS

## 2017-10-03 DIAGNOSIS — G902 Horner's syndrome: Secondary | ICD-10-CM | POA: Diagnosis not present

## 2017-10-15 DIAGNOSIS — G902 Horner's syndrome: Secondary | ICD-10-CM | POA: Diagnosis not present

## 2017-10-15 DIAGNOSIS — S2220XA Unspecified fracture of sternum, initial encounter for closed fracture: Secondary | ICD-10-CM | POA: Diagnosis not present

## 2017-10-15 DIAGNOSIS — Z6822 Body mass index (BMI) 22.0-22.9, adult: Secondary | ICD-10-CM | POA: Diagnosis not present

## 2017-10-24 ENCOUNTER — Other Ambulatory Visit (HOSPITAL_COMMUNITY): Payer: Self-pay | Admitting: Internal Medicine

## 2017-10-24 DIAGNOSIS — L989 Disorder of the skin and subcutaneous tissue, unspecified: Secondary | ICD-10-CM

## 2017-10-26 ENCOUNTER — Ambulatory Visit (HOSPITAL_COMMUNITY): Payer: Medicare HMO

## 2017-10-29 ENCOUNTER — Ambulatory Visit (HOSPITAL_COMMUNITY)
Admission: RE | Admit: 2017-10-29 | Discharge: 2017-10-29 | Disposition: A | Discharge status: Home / Self Care | Payer: Medicare HMO | Source: Ambulatory Visit | Attending: Internal Medicine | Admitting: Internal Medicine

## 2017-10-29 DIAGNOSIS — R937 Abnormal findings on diagnostic imaging of other parts of musculoskeletal system: Secondary | ICD-10-CM | POA: Diagnosis not present

## 2017-10-29 DIAGNOSIS — M899 Disorder of bone, unspecified: Secondary | ICD-10-CM | POA: Diagnosis not present

## 2017-10-29 DIAGNOSIS — L989 Disorder of the skin and subcutaneous tissue, unspecified: Secondary | ICD-10-CM | POA: Insufficient documentation

## 2017-10-29 LAB — POCT I-STAT CREATININE: Creatinine, Ser: 0.8 mg/dL (ref 0.61–1.24)

## 2017-10-29 MED ORDER — GADOBENATE DIMEGLUMINE 529 MG/ML IV SOLN
10.0000 mL | Freq: Once | INTRAVENOUS | Status: AC | PRN
  Administered 2017-10-29: 10 mL via INTRAVENOUS

## 2018-01-08 DIAGNOSIS — Z79899 Other long term (current) drug therapy: Secondary | ICD-10-CM | POA: Diagnosis not present

## 2018-01-08 DIAGNOSIS — Z125 Encounter for screening for malignant neoplasm of prostate: Secondary | ICD-10-CM | POA: Diagnosis not present

## 2018-01-08 DIAGNOSIS — I7300 Raynaud's syndrome without gangrene: Secondary | ICD-10-CM | POA: Diagnosis not present

## 2018-01-08 DIAGNOSIS — G44009 Cluster headache syndrome, unspecified, not intractable: Secondary | ICD-10-CM | POA: Diagnosis not present

## 2018-01-08 DIAGNOSIS — E539 Vitamin B deficiency, unspecified: Secondary | ICD-10-CM | POA: Diagnosis not present

## 2018-01-08 DIAGNOSIS — G902 Horner's syndrome: Secondary | ICD-10-CM | POA: Diagnosis not present

## 2018-01-15 DIAGNOSIS — D519 Vitamin B12 deficiency anemia, unspecified: Secondary | ICD-10-CM | POA: Diagnosis not present

## 2018-01-15 DIAGNOSIS — I700 Atherosclerosis of aorta: Secondary | ICD-10-CM | POA: Diagnosis not present

## 2018-01-15 DIAGNOSIS — Z0001 Encounter for general adult medical examination with abnormal findings: Secondary | ICD-10-CM | POA: Diagnosis not present

## 2018-01-15 DIAGNOSIS — R030 Elevated blood-pressure reading, without diagnosis of hypertension: Secondary | ICD-10-CM | POA: Diagnosis not present

## 2018-02-01 DIAGNOSIS — G902 Horner's syndrome: Secondary | ICD-10-CM | POA: Diagnosis not present

## 2018-02-27 DIAGNOSIS — G902 Horner's syndrome: Secondary | ICD-10-CM | POA: Diagnosis not present

## 2018-02-27 DIAGNOSIS — H57813 Brow ptosis, bilateral: Secondary | ICD-10-CM | POA: Diagnosis not present

## 2018-02-27 DIAGNOSIS — H02831 Dermatochalasis of right upper eyelid: Secondary | ICD-10-CM | POA: Diagnosis not present

## 2018-02-27 DIAGNOSIS — H53483 Generalized contraction of visual field, bilateral: Secondary | ICD-10-CM | POA: Diagnosis not present

## 2018-02-27 DIAGNOSIS — H02423 Myogenic ptosis of bilateral eyelids: Secondary | ICD-10-CM | POA: Diagnosis not present

## 2018-02-27 DIAGNOSIS — H02413 Mechanical ptosis of bilateral eyelids: Secondary | ICD-10-CM | POA: Diagnosis not present

## 2018-02-27 DIAGNOSIS — H02834 Dermatochalasis of left upper eyelid: Secondary | ICD-10-CM | POA: Diagnosis not present

## 2018-02-27 DIAGNOSIS — H0279 Other degenerative disorders of eyelid and periocular area: Secondary | ICD-10-CM | POA: Diagnosis not present

## 2018-03-05 DIAGNOSIS — H53483 Generalized contraction of visual field, bilateral: Secondary | ICD-10-CM | POA: Diagnosis not present

## 2018-04-22 DIAGNOSIS — L209 Atopic dermatitis, unspecified: Secondary | ICD-10-CM | POA: Diagnosis not present

## 2018-04-22 DIAGNOSIS — I10 Essential (primary) hypertension: Secondary | ICD-10-CM | POA: Diagnosis not present

## 2018-04-22 DIAGNOSIS — Z23 Encounter for immunization: Secondary | ICD-10-CM | POA: Diagnosis not present

## 2018-05-20 DIAGNOSIS — G902 Horner's syndrome: Secondary | ICD-10-CM | POA: Diagnosis not present

## 2018-05-20 DIAGNOSIS — H57813 Brow ptosis, bilateral: Secondary | ICD-10-CM | POA: Diagnosis not present

## 2018-05-20 DIAGNOSIS — H02413 Mechanical ptosis of bilateral eyelids: Secondary | ICD-10-CM | POA: Diagnosis not present

## 2018-05-20 DIAGNOSIS — H53483 Generalized contraction of visual field, bilateral: Secondary | ICD-10-CM | POA: Diagnosis not present

## 2018-05-20 DIAGNOSIS — H0279 Other degenerative disorders of eyelid and periocular area: Secondary | ICD-10-CM | POA: Diagnosis not present

## 2018-05-20 DIAGNOSIS — H02834 Dermatochalasis of left upper eyelid: Secondary | ICD-10-CM | POA: Diagnosis not present

## 2018-05-20 DIAGNOSIS — H02831 Dermatochalasis of right upper eyelid: Secondary | ICD-10-CM | POA: Diagnosis not present

## 2018-05-20 DIAGNOSIS — H02423 Myogenic ptosis of bilateral eyelids: Secondary | ICD-10-CM | POA: Diagnosis not present

## 2018-07-30 ENCOUNTER — Ambulatory Visit (INDEPENDENT_AMBULATORY_CARE_PROVIDER_SITE_OTHER): Payer: Medicare HMO | Admitting: Internal Medicine

## 2018-07-30 DIAGNOSIS — Z716 Tobacco abuse counseling: Secondary | ICD-10-CM | POA: Diagnosis not present

## 2018-07-30 DIAGNOSIS — I10 Essential (primary) hypertension: Secondary | ICD-10-CM | POA: Diagnosis not present

## 2018-10-08 ENCOUNTER — Ambulatory Visit (INDEPENDENT_AMBULATORY_CARE_PROVIDER_SITE_OTHER): Payer: Medicare HMO | Admitting: Internal Medicine

## 2018-10-08 ENCOUNTER — Other Ambulatory Visit: Payer: Self-pay

## 2018-10-08 ENCOUNTER — Encounter (INDEPENDENT_AMBULATORY_CARE_PROVIDER_SITE_OTHER): Payer: Self-pay | Admitting: Internal Medicine

## 2018-10-08 VITALS — BP 130/92 | HR 93 | Temp 98.0°F | Resp 18 | Ht 65.0 in | Wt 136.5 lb

## 2018-10-08 DIAGNOSIS — K52832 Lymphocytic colitis: Secondary | ICD-10-CM | POA: Diagnosis not present

## 2018-10-08 NOTE — Patient Instructions (Signed)
Notify if you have diarrhea. Next colonoscopy in May 2022.  Office will remind you.

## 2018-10-08 NOTE — Progress Notes (Signed)
Presenting complaint;  Follow-up for lymphocytic colitis.  Subjective:  Stephen Andersen 68 year old Caucasian male who is here for scheduled visit.  He was last seen in January 2019.  He has several year history of lymphocytic colitis and has been treated with various therapies including mesalamine.  At one point he was on diphenoxylate. He has not been on any therapy for the past several months.  He states he feels fine.  He has 1 bowel movement daily and it is usually formed.  He may have 1 or 2 episodes of diarrhea in a month.  He denies abdominal pain melena or rectal bleeding.  He has gained 8 pounds since his last visit.  He is hoping to gain a few more pounds.  He has cut back cigarette smoking to 1 pack/day.  He is trying his best to drop it even further.   Current Medications: Outpatient Encounter Medications as of 10/08/2018  Medication Sig  . aspirin 81 MG tablet Take 81 mg by mouth daily.  . cyanocobalamin (,VITAMIN B-12,) 1000 MCG/ML injection Inject 1 mL (1,000 mcg total) into the muscle every 30 (thirty) days.  . Inulin (FIBER CHOICE PO) Take by mouth. Chewable tablet - 1 tablet occasionally.  Marland Kitchen NIFEDICAL XL 30 MG 24 hr tablet Take 30 mg by mouth daily.  Marland Kitchen venlafaxine XR (EFFEXOR-XR) 37.5 MG 24 hr capsule Take 37.5 mg by mouth daily with breakfast.  . [DISCONTINUED] gabapentin (NEURONTIN) 300 MG capsule Take 1 capsule (300 mg total) by mouth 3 (three) times daily. (Patient not taking: Reported on 10/08/2018)   No facility-administered encounter medications on file as of 10/08/2018.      Objective: Blood pressure (!) 130/92, pulse 93, temperature 98 F (36.7 C), temperature source Oral, resp. rate 18, height 5\' 5"  (1.651 m), weight 136 lb 8 oz (61.9 kg). Thin Caucasian male in NAD. Conjunctiva is pink. Sclera is nonicteric Oropharyngeal mucosa is normal. No neck masses or thyromegaly noted. Cardiac exam with regular rhythm normal S1 and S2. No murmur or gallop noted. Lungs are  clear to auscultation. Abdomen is flat soft and nontender with organomegaly or masses. No LE edema or clubbing noted.   Assessment:  #1.  History of lymphocytic colitis.  He is presently in remission and not needing any medication.  #2.  History of sessile serrated polyp.  Last colonoscopy was in May 2017 and next colonoscopy would be in May 2022.   Plan:  Patient will call if diarrhea recurs. Office visit on as-needed basis. Next colonoscopy in May 2022.

## 2019-01-16 DIAGNOSIS — I7300 Raynaud's syndrome without gangrene: Secondary | ICD-10-CM | POA: Diagnosis not present

## 2019-01-16 DIAGNOSIS — J439 Emphysema, unspecified: Secondary | ICD-10-CM | POA: Diagnosis not present

## 2019-01-16 DIAGNOSIS — D519 Vitamin B12 deficiency anemia, unspecified: Secondary | ICD-10-CM | POA: Diagnosis not present

## 2019-01-16 DIAGNOSIS — Z79899 Other long term (current) drug therapy: Secondary | ICD-10-CM | POA: Diagnosis not present

## 2019-01-16 DIAGNOSIS — G902 Horner's syndrome: Secondary | ICD-10-CM | POA: Diagnosis not present

## 2019-01-16 DIAGNOSIS — I700 Atherosclerosis of aorta: Secondary | ICD-10-CM | POA: Diagnosis not present

## 2019-01-16 DIAGNOSIS — R030 Elevated blood-pressure reading, without diagnosis of hypertension: Secondary | ICD-10-CM | POA: Diagnosis not present

## 2019-01-16 DIAGNOSIS — Z125 Encounter for screening for malignant neoplasm of prostate: Secondary | ICD-10-CM | POA: Diagnosis not present

## 2019-01-23 DIAGNOSIS — E871 Hypo-osmolality and hyponatremia: Secondary | ICD-10-CM | POA: Diagnosis not present

## 2019-01-23 DIAGNOSIS — E875 Hyperkalemia: Secondary | ICD-10-CM | POA: Diagnosis not present

## 2019-01-23 DIAGNOSIS — I10 Essential (primary) hypertension: Secondary | ICD-10-CM | POA: Diagnosis not present

## 2019-01-23 DIAGNOSIS — I700 Atherosclerosis of aorta: Secondary | ICD-10-CM | POA: Diagnosis not present

## 2019-02-20 DIAGNOSIS — I10 Essential (primary) hypertension: Secondary | ICD-10-CM | POA: Diagnosis not present

## 2019-02-20 DIAGNOSIS — E871 Hypo-osmolality and hyponatremia: Secondary | ICD-10-CM | POA: Diagnosis not present

## 2019-02-20 DIAGNOSIS — E875 Hyperkalemia: Secondary | ICD-10-CM | POA: Diagnosis not present

## 2019-02-28 DIAGNOSIS — E875 Hyperkalemia: Secondary | ICD-10-CM | POA: Diagnosis not present

## 2019-03-20 IMAGING — MR MR CHEST MEDIASTINUM WO/W CM
11 series · 16 of 16 positions shown · IV contrast (multihance)
Comparison: CT chest 02/03/2010 and 09/27/2017.

CLINICAL DATA: Lytic and sclerotic lesion in the sternum by prior
chest CT.

EXAM:
MRI CHEST WITHOUT AND WITH CONTRAST
TECHNIQUE: Multiplanar, multisequence MR imaging of the chest with attention
directed toward the sternum was performed before and after IV
contrast administration.
CONTRAST:  10 ml MULTIHANCE GADOBENATE DIMEGLUMINE 529 MG/ML IV SOLN

[Series 8: t2fs cor · coronal · 3.0mm · 0.78mm/px · 1 of 20 slices shown]
[im 1/20]
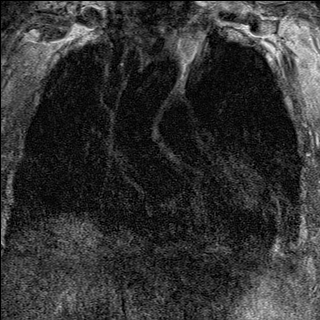

[Series 9: bSSFP · coronal · 4.0mm · 0.49mm/px · 1 of 20 slices shown]
[im 1/20]
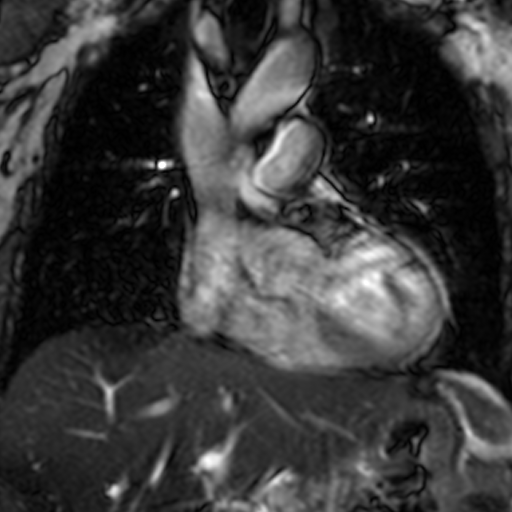

[Series 10: t2fs axial blade · axial · 5.0mm · 0.61mm/px · z∈[-104,+174]mm · 2 of 48 slices shown]
[im 1/48]
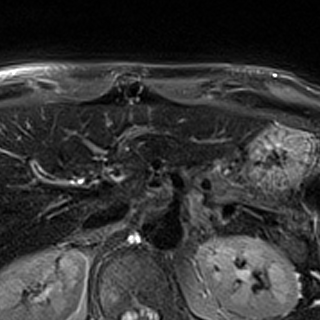
[im 48/48]
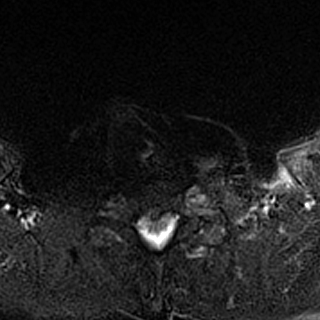

[Series 12: t2fs axial · axial · 5.0mm · 0.46mm/px · 1 of 1 slices shown]
[im 1/1]
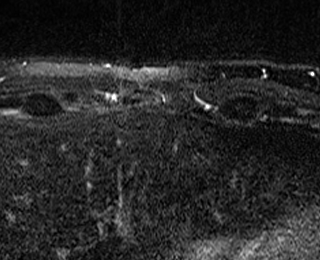

[Series 14: T1 · axial · 5.0mm · 0.58mm/px · 1 of 35 slices shown (1 of 4)]
[im 1/35]
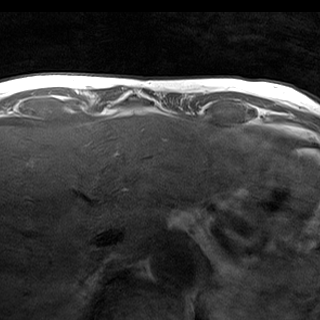

[Series 15: T1 · coronal · 3.0mm · 0.78mm/px · 1 of 30 slices shown (2 of 4)]
[im 1/30]
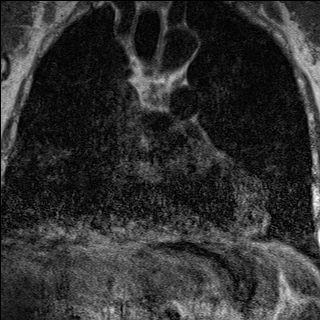

[Series 16: T1 · sagittal · 3.0mm · 0.78mm/px · 1 of 29 slices shown (3 of 4)]
[im 1/29]
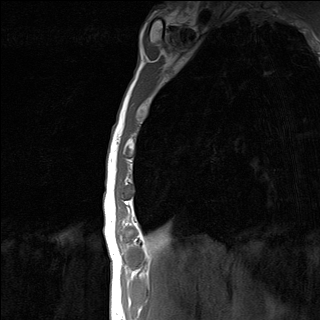

[Series 17: pre t1fs axial · axial · non-contrast · 3.5mm · 0.57mm/px · z∈[-73,+172]mm · 3 of 72 slices shown]
[im 1/72]
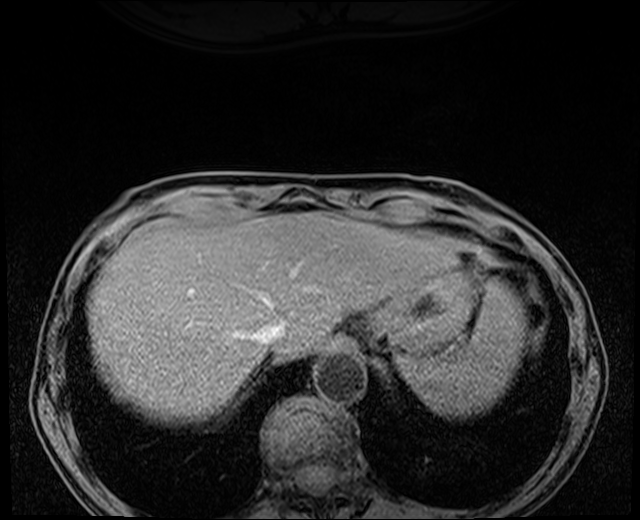
[im 36/72]
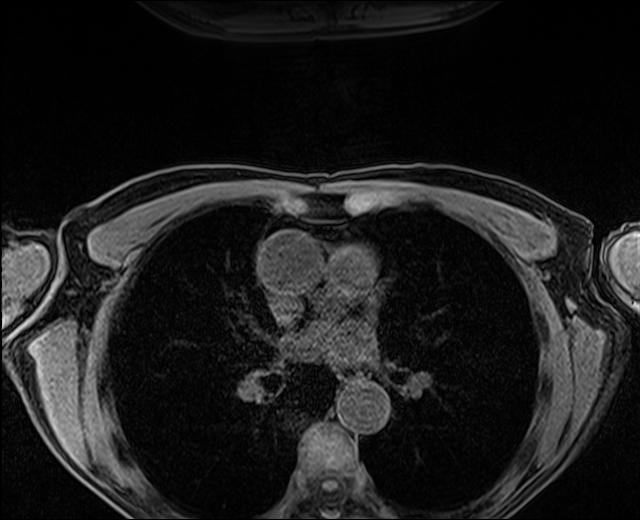
[im 72/72]
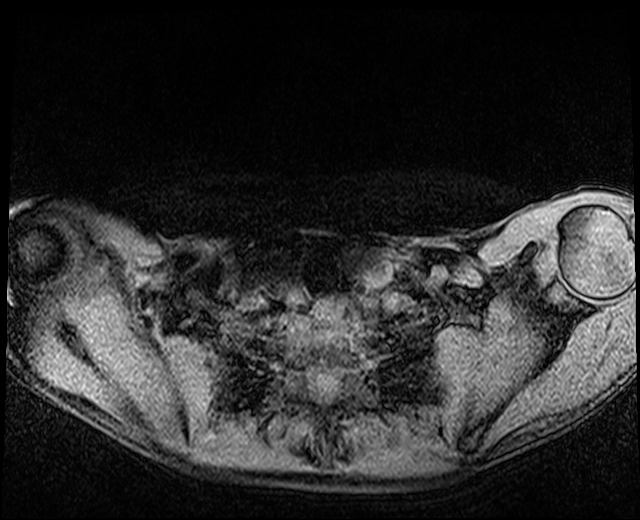

[Series 18: post t1fs axial · axial · 3.5mm · 0.59mm/px · z∈[-72,+173]mm · 3 of 72 slices shown]
[im 1/72]
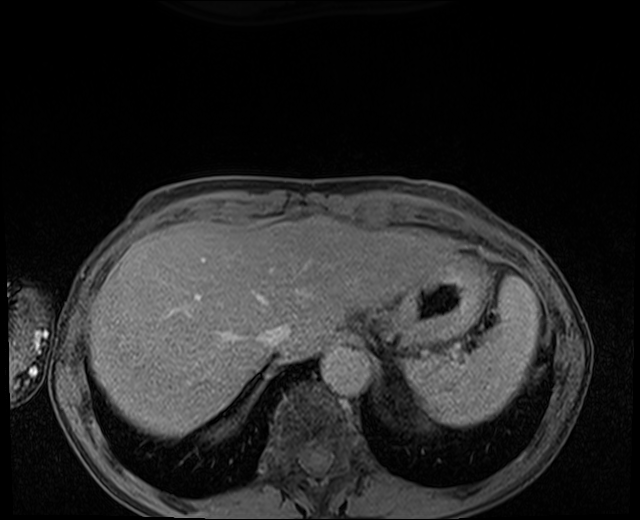
[im 36/72]
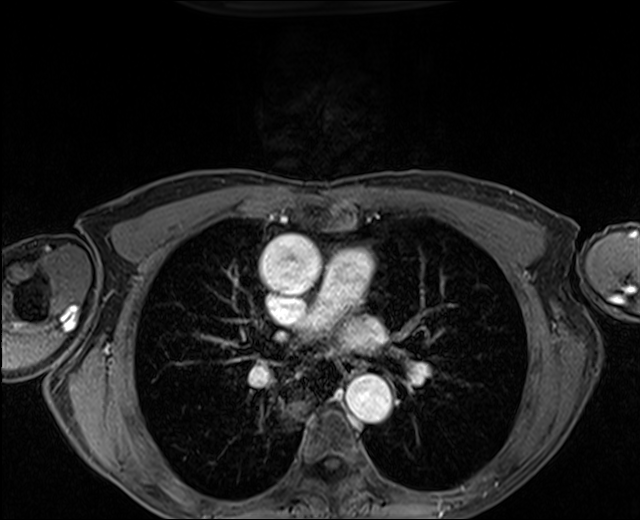
[im 72/72]
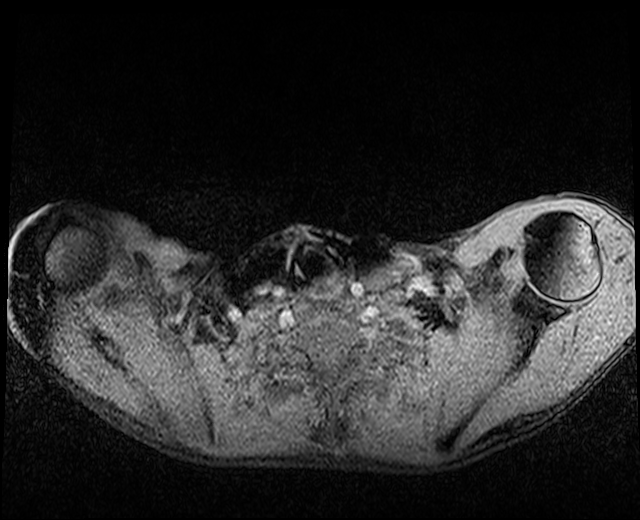

[Series 19: t1fs cor post · coronal · 3.0mm · 0.75mm/px · 1 of 30 slices shown]
[im 1/30]
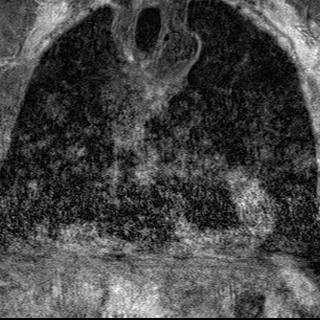

[Series 20: T1 · sagittal · 3.0mm · 0.78mm/px · 1 of 30 slices shown (4 of 4)]
[im 1/30]
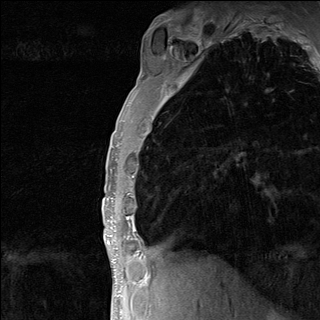

[16 of 16 positions shown; findings below may reference images not displayed]

FINDINGS: The patient has a remote healed fracture just inferior to the
sternomanubrial junction accounting for the finding on the most
recent CT scan. The sternum is mildly expanded with slightly
heterogeneous marrow signal on T1 weighted imaging and scattered
areas of T2 hyperintensity. Patchy enhancement are identified
throughout the sternum. The appearance is most suggestive of fibrous
dysplasia. Imaged bones otherwise appear normal. Soft tissue
structures demonstrate apical scar on the right.
IMPRESSION: Findings most consistent with a remote healed fracture of the
sternum accounting for finding on prior CT. The appearance of the
sternum is most typical of fibrous dysplasia. No follow-up imaging
is recommended.

## 2019-04-03 DIAGNOSIS — E875 Hyperkalemia: Secondary | ICD-10-CM | POA: Diagnosis not present

## 2019-04-03 DIAGNOSIS — E871 Hypo-osmolality and hyponatremia: Secondary | ICD-10-CM | POA: Diagnosis not present

## 2019-04-03 DIAGNOSIS — I7300 Raynaud's syndrome without gangrene: Secondary | ICD-10-CM | POA: Diagnosis not present

## 2019-04-03 DIAGNOSIS — Z720 Tobacco use: Secondary | ICD-10-CM | POA: Diagnosis not present

## 2019-04-03 DIAGNOSIS — I10 Essential (primary) hypertension: Secondary | ICD-10-CM | POA: Diagnosis not present

## 2019-04-14 ENCOUNTER — Ambulatory Visit (INDEPENDENT_AMBULATORY_CARE_PROVIDER_SITE_OTHER): Payer: Medicare HMO | Admitting: Otolaryngology

## 2019-04-14 DIAGNOSIS — H6121 Impacted cerumen, right ear: Secondary | ICD-10-CM | POA: Diagnosis not present

## 2019-04-14 DIAGNOSIS — H9011 Conductive hearing loss, unilateral, right ear, with unrestricted hearing on the contralateral side: Secondary | ICD-10-CM | POA: Diagnosis not present

## 2019-04-25 DIAGNOSIS — I10 Essential (primary) hypertension: Secondary | ICD-10-CM | POA: Diagnosis not present

## 2019-04-25 DIAGNOSIS — E875 Hyperkalemia: Secondary | ICD-10-CM | POA: Diagnosis not present

## 2019-05-05 DIAGNOSIS — L0292 Furuncle, unspecified: Secondary | ICD-10-CM | POA: Diagnosis not present

## 2019-05-05 DIAGNOSIS — Z23 Encounter for immunization: Secondary | ICD-10-CM | POA: Diagnosis not present

## 2019-05-13 DIAGNOSIS — H2513 Age-related nuclear cataract, bilateral: Secondary | ICD-10-CM | POA: Diagnosis not present

## 2019-05-13 DIAGNOSIS — G902 Horner's syndrome: Secondary | ICD-10-CM | POA: Diagnosis not present

## 2019-07-03 DIAGNOSIS — G459 Transient cerebral ischemic attack, unspecified: Secondary | ICD-10-CM | POA: Diagnosis not present

## 2019-07-04 ENCOUNTER — Other Ambulatory Visit: Payer: Self-pay | Admitting: Internal Medicine

## 2019-07-04 DIAGNOSIS — I639 Cerebral infarction, unspecified: Secondary | ICD-10-CM

## 2019-07-09 ENCOUNTER — Ambulatory Visit (HOSPITAL_COMMUNITY): Payer: Medicare HMO

## 2019-07-15 ENCOUNTER — Ambulatory Visit (HOSPITAL_COMMUNITY)
Admission: RE | Admit: 2019-07-15 | Discharge: 2019-07-15 | Disposition: A | Discharge status: Home / Self Care | Payer: Medicare HMO | Source: Ambulatory Visit | Attending: Internal Medicine | Admitting: Internal Medicine

## 2019-07-15 ENCOUNTER — Other Ambulatory Visit: Payer: Self-pay

## 2019-07-15 DIAGNOSIS — R531 Weakness: Secondary | ICD-10-CM | POA: Diagnosis not present

## 2019-07-15 DIAGNOSIS — I639 Cerebral infarction, unspecified: Secondary | ICD-10-CM | POA: Diagnosis not present

## 2019-07-29 DIAGNOSIS — D225 Melanocytic nevi of trunk: Secondary | ICD-10-CM | POA: Diagnosis not present

## 2019-07-29 DIAGNOSIS — L281 Prurigo nodularis: Secondary | ICD-10-CM | POA: Diagnosis not present

## 2019-07-29 DIAGNOSIS — D1801 Hemangioma of skin and subcutaneous tissue: Secondary | ICD-10-CM | POA: Diagnosis not present

## 2019-07-29 DIAGNOSIS — L853 Xerosis cutis: Secondary | ICD-10-CM | POA: Diagnosis not present

## 2019-07-29 DIAGNOSIS — L308 Other specified dermatitis: Secondary | ICD-10-CM | POA: Diagnosis not present

## 2019-07-29 DIAGNOSIS — L570 Actinic keratosis: Secondary | ICD-10-CM | POA: Diagnosis not present

## 2019-07-29 DIAGNOSIS — L821 Other seborrheic keratosis: Secondary | ICD-10-CM | POA: Diagnosis not present

## 2019-08-13 ENCOUNTER — Ambulatory Visit: Payer: Medicare Other | Attending: Internal Medicine

## 2019-08-13 DIAGNOSIS — Z23 Encounter for immunization: Secondary | ICD-10-CM | POA: Insufficient documentation

## 2019-08-13 NOTE — Progress Notes (Signed)
   Covid-19 Vaccination Clinic  Name:  Stephen Andersen    MRN: [id number] DOB: April 12, 1951  08/13/2019  Mr. Stephen Andersen was observed post Covid-19 immunization for 15 minutes without incidence. He was provided with Vaccine Information Sheet and instruction to access the V-Safe system.   Mr. Stephen Andersen was instructed to call 911 with any severe reactions post vaccine: Marland Kitchen Difficulty breathing  . Swelling of your face and throat  . A fast heartbeat  . A bad rash all over your body  . Dizziness and weakness    Immunizations Administered    Name Date Dose VIS Date Route   Pfizer COVID-19 Vaccine 08/13/2019 11:49 AM 0.3 mL 07/04/2019 Intramuscular   Manufacturer: [place]   Lot: [id number]   [place]: [id number]

## 2019-09-03 ENCOUNTER — Ambulatory Visit: Payer: Medicare HMO | Attending: Internal Medicine

## 2019-09-03 DIAGNOSIS — Z23 Encounter for immunization: Secondary | ICD-10-CM

## 2019-09-03 NOTE — Progress Notes (Signed)
   Covid-19 Vaccination Clinic  Name:  Stephen Andersen    MRN: [id number] DOB: 02-28-1951  09/03/2019  Stephen Andersen was observed post Covid-19 immunization for 15 minutes without incidence. He was provided with Vaccine Information Sheet and instruction to access the V-Safe system.   Stephen Andersen was instructed to call 911 with any severe reactions post vaccine: Marland Kitchen Difficulty breathing  . Swelling of your face and throat  . A fast heartbeat  . A bad rash all over your body  . Dizziness and weakness    Immunizations Administered    Name Date Dose VIS Date Route   Pfizer COVID-19 Vaccine 09/03/2019  5:11 PM 0.3 mL 07/04/2019 Intramuscular   Manufacturer: [place]   Lot: [id number]   [place]: [id number]

## 2020-03-22 ENCOUNTER — Other Ambulatory Visit: Payer: Self-pay | Admitting: Internal Medicine

## 2020-03-22 ENCOUNTER — Other Ambulatory Visit (HOSPITAL_COMMUNITY): Payer: Self-pay | Admitting: Internal Medicine

## 2020-03-22 DIAGNOSIS — F17210 Nicotine dependence, cigarettes, uncomplicated: Secondary | ICD-10-CM

## 2020-03-31 ENCOUNTER — Ambulatory Visit (HOSPITAL_COMMUNITY): Payer: Medicare HMO

## 2020-04-09 ENCOUNTER — Ambulatory Visit (HOSPITAL_COMMUNITY)
Admission: RE | Admit: 2020-04-09 | Discharge: 2020-04-09 | Disposition: A | Discharge status: Home / Self Care | Payer: Medicare HMO | Source: Ambulatory Visit | Attending: Internal Medicine | Admitting: Internal Medicine

## 2020-04-09 ENCOUNTER — Other Ambulatory Visit: Payer: Self-pay

## 2020-04-09 DIAGNOSIS — F17210 Nicotine dependence, cigarettes, uncomplicated: Secondary | ICD-10-CM | POA: Diagnosis present

## 2020-05-06 ENCOUNTER — Ambulatory Visit: Payer: Medicare HMO | Attending: Internal Medicine

## 2020-05-06 DIAGNOSIS — Z23 Encounter for immunization: Secondary | ICD-10-CM

## 2020-05-06 NOTE — Progress Notes (Signed)
   Covid-19 Vaccination Clinic  Name:  FUE CERVENKA    MRN: [id number] DOB: Apr 19, 1951  05/06/2020  Mr. Diguglielmo was observed post Covid-19 immunization for 15 minutes without incident. He was provided with Vaccine Information Sheet and instruction to access the V-Safe system.   Mr. Muench was instructed to call 911 with any severe reactions post vaccine: Marland Kitchen Difficulty breathing  . Swelling of face and throat  . A fast heartbeat  . A bad rash all over body  . Dizziness and weakness

## 2020-12-09 ENCOUNTER — Encounter (INDEPENDENT_AMBULATORY_CARE_PROVIDER_SITE_OTHER): Payer: Self-pay | Admitting: *Deleted

## 2021-03-24 ENCOUNTER — Other Ambulatory Visit: Payer: Self-pay | Admitting: Internal Medicine

## 2021-03-24 ENCOUNTER — Other Ambulatory Visit (HOSPITAL_COMMUNITY): Payer: Self-pay | Admitting: Internal Medicine

## 2021-03-24 DIAGNOSIS — F17200 Nicotine dependence, unspecified, uncomplicated: Secondary | ICD-10-CM

## 2021-03-24 DIAGNOSIS — F17210 Nicotine dependence, cigarettes, uncomplicated: Secondary | ICD-10-CM

## 2021-03-30 ENCOUNTER — Telehealth (INDEPENDENT_AMBULATORY_CARE_PROVIDER_SITE_OTHER): Payer: Self-pay

## 2021-03-30 ENCOUNTER — Encounter (INDEPENDENT_AMBULATORY_CARE_PROVIDER_SITE_OTHER): Payer: Self-pay

## 2021-03-30 ENCOUNTER — Other Ambulatory Visit (INDEPENDENT_AMBULATORY_CARE_PROVIDER_SITE_OTHER): Payer: Self-pay

## 2021-03-30 MED ORDER — PEG 3350-KCL-NA BICARB-NACL 420 G PO SOLR: 4000.0000 mL | ORAL | 0 refills | Status: DC

## 2021-03-30 NOTE — Telephone Encounter (Signed)
Referring MD/PCP: Willey Blade  Procedure: Tcs  Reason/Indication:  History of Polyps  Has patient had this procedure before?  yes  If so, when, by whom and where?  11/2015  Is there a family history of colon cancer?  no  Who?  What age when diagnosed?    Is patient diabetic? If yes, Type 1 or Type 2   no      Does patient have prosthetic heart valve or mechanical valve?  no  Do you have a pacemaker/defibrillator?  no  Has patient ever had endocarditis/atrial fibrillation? no  Does patient use oxygen? no  Has patient had joint replacement within last 12 months?  no  Is patient constipated or do they take laxatives? no  Does patient have a history of alcohol/drug use?  no  Have you had a stroke/heart attack last 6 mths? no  Do you take medicine for weight loss?  no  For male patients,: do you still have your menstrual cycle? N/A  Is patient on blood thinner such as Coumadin, Plavix and/or Aspirin? yes  Medications: Asa 81 mg daily, Atorvastatin 20 mg daily, Venlafaxine37.5 mg daily, nifedipine 30 mg daily, Vit B12 inj once a month  Allergies: nkda  Medication Adjustment per Dr Laural Golden [place] 2 days prior   Procedure date & time: 04/13/21 10:55

## 2021-03-30 NOTE — Telephone Encounter (Signed)
LeighAnn Luisa Louk, CMA  

## 2021-03-31 ENCOUNTER — Encounter (INDEPENDENT_AMBULATORY_CARE_PROVIDER_SITE_OTHER): Payer: Self-pay

## 2021-04-06 NOTE — Patient Instructions (Signed)
   Your procedure is scheduled on: 04/13/2021  Report to [facility] at   9:30  AM.  Call this number if you have problems the morning of surgery: [phone]   Remember:              Follow Directions on the letter you received from Your Physician's office regarding the Bowel Prep              No Smoking the day of Procedure :   Take these medicines the morning of surgery with A SIP OF WATER: Effexor and Nifedical   Do not wear jewelry, make-up or nail polish.    Do not bring valuables to the hospital.  Contacts, dentures or bridgework may not be worn into surgery.  .   Patients discharged the day of surgery will not be allowed to drive home.     Colonoscopy, Adult, Care After This sheet gives you information about how to care for yourself after your procedure. Your health care provider may also give you more specific instructions. If you have problems or questions, contact your health care provider. What can I expect after the procedure? After the procedure, it is common to have: A small amount of blood in your stool for 24 hours after the procedure. Some gas. Mild abdominal cramping or bloating.  Follow these instructions at home: General instructions  For the first 24 hours after the procedure: Do not drive or use machinery. Do not sign important documents. Do not drink alcohol. Do your regular daily activities at a slower pace than normal. Eat soft, easy-to-digest foods. Rest often. Take over-the-counter or prescription medicines only as told by your health care provider. It is up to you to get the results of your procedure. Ask your health care provider, or the department performing the procedure, when your results will be ready. Relieving cramping and bloating Try walking around when you have cramps or feel bloated. Apply heat to your abdomen as told by your health care provider. Use a heat source that your health care provider recommends, such as a moist heat pack or  a heating pad. Place a towel between your skin and the heat source. Leave the heat on for 20-30 minutes. Remove the heat if your skin turns bright red. This is especially important if you are unable to feel pain, heat, or cold. You may have a greater risk of getting burned. Eating and drinking Drink enough fluid to keep your urine clear or pale yellow. Resume your normal diet as instructed by your health care provider. Avoid heavy or fried foods that are hard to digest. Avoid drinking alcohol for as long as instructed by your health care provider. Contact a health care provider if: You have blood in your stool 2-3 days after the procedure. Get help right away if: You have more than a small spotting of blood in your stool. You pass large blood clots in your stool. Your abdomen is swollen. You have nausea or vomiting. You have a fever. You have increasing abdominal pain that is not relieved with medicine. This information is not intended to replace advice given to you by your health care provider. Make sure you discuss any questions you have with your health care provider. Document Released: 02/22/2004 Document Revised: 04/03/2016 Document Reviewed: 09/21/2015 Elsevier Interactive Patient Education  [employer].

## 2021-04-11 ENCOUNTER — Encounter (HOSPITAL_COMMUNITY): Payer: Self-pay

## 2021-04-11 ENCOUNTER — Encounter (HOSPITAL_COMMUNITY)
Admission: RE | Admit: 2021-04-11 | Discharge: 2021-04-11 | Disposition: A | Discharge status: Home / Self Care | Payer: Medicare HMO | Source: Ambulatory Visit | Attending: Internal Medicine | Admitting: Internal Medicine

## 2021-04-11 ENCOUNTER — Other Ambulatory Visit: Payer: Self-pay

## 2021-04-13 ENCOUNTER — Ambulatory Visit (HOSPITAL_COMMUNITY)
Admission: RE | Admit: 2021-04-13 | Discharge: 2021-04-13 | Disposition: A | Discharge status: Home / Self Care | Payer: Medicare HMO | Attending: Internal Medicine | Admitting: Internal Medicine

## 2021-04-13 ENCOUNTER — Ambulatory Visit (HOSPITAL_COMMUNITY): Payer: Medicare HMO | Admitting: Anesthesiology

## 2021-04-13 ENCOUNTER — Other Ambulatory Visit: Payer: Self-pay

## 2021-04-13 ENCOUNTER — Encounter (HOSPITAL_COMMUNITY): Payer: Self-pay | Admitting: Internal Medicine

## 2021-04-13 ENCOUNTER — Encounter (HOSPITAL_COMMUNITY)
Admission: RE | Disposition: A | Discharge status: Home / Self Care | Payer: Self-pay | Source: Home / Self Care | Attending: Internal Medicine

## 2021-04-13 DIAGNOSIS — Z7982 Long term (current) use of aspirin: Secondary | ICD-10-CM | POA: Insufficient documentation

## 2021-04-13 DIAGNOSIS — K5730 Diverticulosis of large intestine without perforation or abscess without bleeding: Secondary | ICD-10-CM | POA: Insufficient documentation

## 2021-04-13 DIAGNOSIS — K635 Polyp of colon: Secondary | ICD-10-CM | POA: Diagnosis not present

## 2021-04-13 DIAGNOSIS — D123 Benign neoplasm of transverse colon: Secondary | ICD-10-CM | POA: Insufficient documentation

## 2021-04-13 DIAGNOSIS — D122 Benign neoplasm of ascending colon: Secondary | ICD-10-CM | POA: Insufficient documentation

## 2021-04-13 DIAGNOSIS — Z79899 Other long term (current) drug therapy: Secondary | ICD-10-CM | POA: Insufficient documentation

## 2021-04-13 DIAGNOSIS — F17210 Nicotine dependence, cigarettes, uncomplicated: Secondary | ICD-10-CM | POA: Insufficient documentation

## 2021-04-13 DIAGNOSIS — Z09 Encounter for follow-up examination after completed treatment for conditions other than malignant neoplasm: Secondary | ICD-10-CM | POA: Diagnosis not present

## 2021-04-13 DIAGNOSIS — K644 Residual hemorrhoidal skin tags: Secondary | ICD-10-CM | POA: Insufficient documentation

## 2021-04-13 DIAGNOSIS — K648 Other hemorrhoids: Secondary | ICD-10-CM

## 2021-04-13 DIAGNOSIS — Z86010 Personal history of colonic polyps: Secondary | ICD-10-CM | POA: Diagnosis not present

## 2021-04-13 DIAGNOSIS — D124 Benign neoplasm of descending colon: Secondary | ICD-10-CM | POA: Diagnosis not present

## 2021-04-13 DIAGNOSIS — Z1211 Encounter for screening for malignant neoplasm of colon: Secondary | ICD-10-CM | POA: Insufficient documentation

## 2021-04-13 HISTORY — PX: HEMOSTASIS CLIP PLACEMENT: SHX6857

## 2021-04-13 HISTORY — PX: POLYPECTOMY: SHX5525

## 2021-04-13 HISTORY — PX: COLONOSCOPY WITH PROPOFOL: SHX5780

## 2021-04-13 LAB — HM COLONOSCOPY

## 2021-04-13 SURGERY — COLONOSCOPY WITH PROPOFOL
Anesthesia: General

## 2021-04-13 MED ORDER — PHENYLEPHRINE 40 MCG/ML (10ML) SYRINGE FOR IV PUSH (FOR BLOOD PRESSURE SUPPORT)
PREFILLED_SYRINGE | INTRAVENOUS | Status: DC | PRN
  Administered 2021-04-13 (×3): 80 ug via INTRAVENOUS

## 2021-04-13 MED ORDER — STERILE WATER FOR IRRIGATION IR SOLN
Status: DC | PRN
  Administered 2021-04-13: 100 mL

## 2021-04-13 MED ORDER — MIDAZOLAM HCL 2 MG/2ML IJ SOLN
INTRAMUSCULAR | Status: DC | PRN
  Administered 2021-04-13: 2 mg via INTRAVENOUS

## 2021-04-13 MED ORDER — LIDOCAINE HCL (CARDIAC) PF 100 MG/5ML IV SOSY
PREFILLED_SYRINGE | INTRAVENOUS | Status: DC | PRN
  Administered 2021-04-13: 50 mg via INTRAVENOUS

## 2021-04-13 MED ORDER — LACTATED RINGERS IV SOLN
INTRAVENOUS | Status: DC
  Administered 2021-04-13: 1000 mL via INTRAVENOUS

## 2021-04-13 MED ORDER — MIDAZOLAM HCL 2 MG/2ML IJ SOLN
INTRAMUSCULAR | Status: AC
  Filled 2021-04-13: qty 2

## 2021-04-13 MED ORDER — PROPOFOL 10 MG/ML IV BOLUS
INTRAVENOUS | Status: DC | PRN
  Administered 2021-04-13: 50 mg via INTRAVENOUS
  Administered 2021-04-13: 100 mg via INTRAVENOUS

## 2021-04-13 MED ORDER — PROPOFOL 500 MG/50ML IV EMUL
INTRAVENOUS | Status: DC | PRN
  Administered 2021-04-13: 150 ug/kg/min via INTRAVENOUS

## 2021-04-13 MED ORDER — PHENYLEPHRINE 40 MCG/ML (10ML) SYRINGE FOR IV PUSH (FOR BLOOD PRESSURE SUPPORT)
PREFILLED_SYRINGE | INTRAVENOUS | Status: AC
  Filled 2021-04-13: qty 10

## 2021-04-13 NOTE — Anesthesia Preprocedure Evaluation (Signed)
Anesthesia Evaluation  Patient identified by MRN, date of birth, ID band Patient awake    Reviewed: Allergy & Precautions, H&P , NPO status , Patient's Chart, lab work & pertinent test results, reviewed documented beta blocker date and time   Airway Mallampati: II  TM Distance: >3 FB Neck ROM: full    Dental no notable dental hx.    Pulmonary neg pulmonary ROS, Current Smoker,    Pulmonary exam normal breath sounds clear to auscultation       Cardiovascular Exercise Tolerance: Good hypertension, negative cardio ROS   Rhythm:regular Rate:Normal     Neuro/Psych  Headaches, PSYCHIATRIC DISORDERS Depression    GI/Hepatic negative GI ROS, Neg liver ROS,   Endo/Other  negative endocrine ROS  Renal/GU negative Renal ROS  negative genitourinary   Musculoskeletal   Abdominal   Peds  Hematology negative hematology ROS (+)   Anesthesia Other Findings   Reproductive/Obstetrics negative OB ROS                             Anesthesia Physical Anesthesia Plan  ASA: 2  Anesthesia Plan: General   Post-op Pain Management:    Induction:   PONV Risk Score and Plan: Propofol infusion  Airway Management Planned:   Additional Equipment:   Intra-op Plan:   Post-operative Plan:   Informed Consent: I have reviewed the patients History and Physical, chart, labs and discussed the procedure including the risks, benefits and alternatives for the proposed anesthesia with the patient or authorized representative who has indicated his/her understanding and acceptance.     Dental Advisory Given  Plan Discussed with: CRNA  Anesthesia Plan Comments:         Anesthesia Quick Evaluation

## 2021-04-13 NOTE — Anesthesia Postprocedure Evaluation (Signed)
Anesthesia Post Note  Patient: Stephen Andersen  Procedure(s) Performed: COLONOSCOPY WITH PROPOFOL POLYPECTOMY HEMOSTASIS CLIP PLACEMENT  Patient location during evaluation: Phase II Anesthesia Type: General Level of consciousness: awake Pain management: pain level controlled Vital Signs Assessment: post-procedure vital signs reviewed and stable Respiratory status: spontaneous breathing and respiratory function stable Cardiovascular status: blood pressure returned to baseline and stable Postop Assessment: no headache and no apparent nausea or vomiting Anesthetic complications: no Comments: Late entry   No notable events documented.   Last Vitals:  Vitals:   04/13/21 1001 04/13/21 1216  BP: (!) 148/89 94/64  Pulse: 90 (!) 59  Resp: (!) 23 16  Temp: 36.6 C (!) 36.3 C  SpO2: 100% 98%    Last Pain:  Vitals:   04/13/21 1216  TempSrc: Axillary  PainSc: 0-No pain                 Louann Sjogren

## 2021-04-13 NOTE — H&P (Signed)
Stephen Andersen is an 70 y.o. male.   Chief Complaint: Patient is here for colonoscopy. HPI: Patient is 70 year old Caucasian male who is here for surveillance colonoscopy.  His last exam was in May 2017 with removal of polyp from ascending colon and it was sessile serrated polyp.  He denies abdominal pain or rectal bleeding.  He is having 1-2 formed stools daily.  He has history of lymphocytic colitis but he has remained in remission for several years. Family history is negative for CRC. He is on low-dose aspirin which is on hold for the procedure.  Past Medical History:  Diagnosis Date   Bone spur    Depression    Headache    Hypertension    Microscopic colitis    Neck pain     Past Surgical History:  Procedure Laterality Date   Colonoscopy     COLONOSCOPY N/A 12/22/2015   Procedure: COLONOSCOPY;  Surgeon: Rogene Houston, MD;  Location: [facility];  Service: Endoscopy;  Laterality: N/A;  930   EYE SURGERY     I & D EXTREMITY Right 06/01/2015   Procedure: INCISION AND DRAINAGE RIGHT LONG FINGER;  Surgeon: Leanora Cover, MD;  Location: [facility];  Service: Orthopedics;  Laterality: Right;   Right hand surgery      Family History  Problem Relation Age of Onset   Stroke Mother    Heart attack Father    Esophageal cancer Paternal Grandmother    Social History:  reports that he has been smoking cigarettes. He has a 40.00 pack-year smoking history. He has never used smokeless tobacco. He reports current alcohol use. He reports that he does not use drugs.  Allergies: No Known Allergies  Medications Prior to Admission  Medication Sig Dispense Refill   aspirin 81 MG tablet Take 81 mg by mouth daily.     atorvastatin (LIPITOR) 20 MG tablet Take 20 mg by mouth at bedtime.     cyanocobalamin (,VITAMIN B-12,) 1000 MCG/ML injection Inject 1 mL (1,000 mcg total) into the muscle every 30 (thirty) days. 3 mL 4   NIFEDICAL XL 30 MG 24 hr tablet Take 30 mg by mouth daily.  2   polyethylene  glycol-electrolytes (TRILYTE) 420 g solution Take 4,000 mLs by mouth as directed. 4000 mL 0   venlafaxine XR (EFFEXOR-XR) 37.5 MG 24 hr capsule Take 37.5 mg by mouth See admin instructions. Takes one every 3 to 4 days.      No results found for this or any previous visit (from the past 48 hour(s)). No results found.  Review of Systems  Blood pressure (!) 148/89, pulse 90, temperature 97.8 F (36.6 C), temperature source Oral, resp. rate (!) 23, SpO2 100 %. Physical Exam HENT:     Mouth/Throat:     Mouth: Mucous membranes are moist.     Pharynx: Oropharynx is clear.  Eyes:     General: No scleral icterus.    Conjunctiva/sclera: Conjunctivae normal.  Cardiovascular:     Rate and Rhythm: Normal rate and regular rhythm.     Heart sounds: Normal heart sounds. No murmur heard. Pulmonary:     Effort: Pulmonary effort is normal.     Breath sounds: Normal breath sounds.  Abdominal:     General: There is no distension.     Palpations: Abdomen is soft.     Tenderness: There is no abdominal tenderness.  Musculoskeletal:        General: No swelling.     Cervical back:  Neck supple.  Lymphadenopathy:     Cervical: No cervical adenopathy.  Skin:    General: Skin is warm and dry.  Neurological:     Mental Status: He is alert.     Assessment/Plan  History of sessile serrated polyp. Surveillance colonoscopy under monitored anesthesia care.  Hildred Laser, MD 04/13/2021, 11:34 AM

## 2021-04-13 NOTE — Op Note (Signed)
[facility] Patient Name: Stephen Andersen Procedure Date: 04/13/2021 11:22 AM MRN: [id number] Date of Birth: 18-Jan-1951 Attending MD: Hildred Laser , MD CSN: [id number] Age: 70 Admit Type: Outpatient Procedure:                Colonoscopy Indications:              High risk colon cancer surveillance: Personal                            history of colonic polyps Providers:                Hildred Laser, MD, Rosina Lowenstein, RN, Nelma Rothman,                            Technician Referring MD:             Asencion Noble, MD Medicines:                Propofol per Anesthesia Complications:            No immediate complications. Estimated Blood Loss:     Estimated blood loss was minimal. Procedure:                Pre-Anesthesia Assessment:                           - Prior to the procedure, a History and Physical                            was performed, and patient medications and                            allergies were reviewed. The patient's tolerance of                            previous anesthesia was also reviewed. The risks                            and benefits of the procedure and the sedation                            options and risks were discussed with the patient.                            All questions were answered, and informed consent                            was obtained. Prior Anticoagulants: The patient has                            taken no previous anticoagulant or antiplatelet                            agents except for aspirin. ASA Grade Assessment: II                            -  A patient with mild systemic disease. After                            reviewing the risks and benefits, the patient was                            deemed in satisfactory condition to undergo the                            procedure.                           After obtaining informed consent, the colonoscope                            was passed under direct vision. Throughout the                             procedure, the patient's blood pressure, pulse, and                            oxygen saturations were monitored continuously. The                            [id number]-[id number] ([id number]) scope was introduced through                            the anus and advanced to the the cecum, identified                            by appendiceal orifice and ileocecal valve. The                            colonoscopy was technically difficult and complex                            due to significant looping. The patient tolerated                            the procedure well. The quality of the bowel                            preparation was adequate. The ileocecal valve,                            appendiceal orifice, and rectum were photographed. Scope In: 11:42:19 AM Scope Out: 12:11:37 PM Scope Withdrawal Time: 0 hours 15 minutes 19 seconds  Total Procedure Duration: 0 hours 29 minutes 18 seconds  Findings:      The perianal and digital rectal examinations were normal.      Two polyps were found in the distal transverse colon and ascending       colon. The polyps were 5 to 6 mm in size. These polyps were removed with       a cold snare. Resection and retrieval  were complete. The pathology       specimen was placed into Bottle Number 1.      A 6 mm polyp was found in the distal descending colon. The polyp was       multi-lobulated. The polyp was removed with a cold snare. Resection and       retrieval were complete. For hemostasis, one hemostatic clip was       successfully placed (MR conditional). There was no bleeding at the end       of the procedure.      Multiple diverticula were found in the sigmoid colon and descending       colon.      External hemorrhoids were found during retroflexion. The hemorrhoids       were small. Impression:               - Two 5 to 6 mm polyps in the distal transverse                            colon and in the ascending colon, removed with a                             cold snare. Resected and retrieved.                           - One 6 mm polyp in the distal descending colon,                            removed with a cold snare. Resected and retrieved.                            Clip (MR conditional) was placed.                           - Diverticulosis in the sigmoid colon and in the                            descending colon.                           - External hemorrhoids. Moderate Sedation:      Per Anesthesia Care Recommendation:           - Patient has a contact number available for                            emergencies. The signs and symptoms of potential                            delayed complications were discussed with the                            patient. Return to normal activities tomorrow.                            Written discharge instructions were provided to the  patient.                           - High fiber diet today.                           - Continue present medications.                           - No aspirin, ibuprofen, naproxen, or other                            non-steroidal anti-inflammatory drugs for 2 days.                           - Await pathology results.                           - Repeat colonoscopy in 5 years for surveillance. Procedure Code(s):        --- Professional ---                           [phone], Colonoscopy, flexible; with removal of                            tumor(s), polyp(s), or other lesion(s) by snare                            technique Diagnosis Code(s):        --- Professional ---                           K63.5, Polyp of colon                           Z86.010, Personal history of colonic polyps                           K64.4, Residual hemorrhoidal skin tags                           K57.30, Diverticulosis of large intestine without                            perforation or abscess without bleeding CPT copyright 2019 American  Medical Association. All rights reserved. The codes documented in this report are preliminary and upon coder review may  be revised to meet current compliance requirements. Hildred Laser, MD Hildred Laser, MD 04/13/2021 12:20:24 PM This report has been signed electronically. Number of Addenda: 0

## 2021-04-13 NOTE — Transfer of Care (Signed)
Immediate Anesthesia Transfer of Care Note  Patient: Stephen Andersen  Procedure(s) Performed: COLONOSCOPY WITH PROPOFOL POLYPECTOMY HEMOSTASIS CLIP PLACEMENT  Patient Location: Short Stay  Anesthesia Type:General  Level of Consciousness: drowsy  Airway & Oxygen Therapy: Patient Spontanous Breathing  Post-op Assessment: Report given to RN and Post -op Vital signs reviewed and stable  Post vital signs: Reviewed and stable  Last Vitals:  Vitals Value Taken Time  BP    Temp    Pulse    Resp    SpO2      Last Pain:  Vitals:   04/13/21 1138  TempSrc:   PainSc: 0-No pain      Patients Stated Pain Goal: 8 (46/21/94 7125)  Complications: No notable events documented.

## 2021-04-13 NOTE — Discharge Instructions (Signed)
Resume aspirin on 04/15/2021 Resume other medications as before. High-fiber diet. No driving for 24 hours. Physician will call with biopsy results.

## 2021-04-13 NOTE — Anesthesia Procedure Notes (Signed)
Date/Time: 04/13/2021 11:42 AM Performed by: Orlie Dakin, CRNA Pre-anesthesia Checklist: Patient identified, Emergency Drugs available, Suction available and Patient being monitored Patient Re-evaluated:Patient Re-evaluated prior to induction Oxygen Delivery Method: Nasal cannula Induction Type: IV induction Placement Confirmation: positive ETCO2

## 2021-04-15 LAB — SURGICAL PATHOLOGY

## 2021-04-18 ENCOUNTER — Encounter (INDEPENDENT_AMBULATORY_CARE_PROVIDER_SITE_OTHER): Payer: Self-pay | Admitting: *Deleted

## 2021-04-19 ENCOUNTER — Encounter (HOSPITAL_COMMUNITY): Payer: Self-pay | Admitting: Internal Medicine

## 2021-04-21 ENCOUNTER — Ambulatory Visit (HOSPITAL_COMMUNITY): Payer: Medicare HMO

## 2021-04-21 ENCOUNTER — Encounter (HOSPITAL_COMMUNITY): Payer: Self-pay

## 2021-04-29 ENCOUNTER — Ambulatory Visit (HOSPITAL_COMMUNITY)
Admission: RE | Admit: 2021-04-29 | Discharge: 2021-04-29 | Disposition: A | Discharge status: Home / Self Care | Payer: Medicare HMO | Source: Ambulatory Visit | Attending: Internal Medicine | Admitting: Internal Medicine

## 2021-04-29 ENCOUNTER — Other Ambulatory Visit: Payer: Self-pay

## 2021-04-29 DIAGNOSIS — F17210 Nicotine dependence, cigarettes, uncomplicated: Secondary | ICD-10-CM

## 2021-04-29 DIAGNOSIS — F17200 Nicotine dependence, unspecified, uncomplicated: Secondary | ICD-10-CM

## 2022-03-31 ENCOUNTER — Other Ambulatory Visit: Payer: Self-pay | Admitting: Internal Medicine

## 2022-03-31 ENCOUNTER — Other Ambulatory Visit (HOSPITAL_COMMUNITY): Payer: Self-pay | Admitting: Internal Medicine

## 2022-03-31 DIAGNOSIS — F17200 Nicotine dependence, unspecified, uncomplicated: Secondary | ICD-10-CM

## 2022-05-11 ENCOUNTER — Ambulatory Visit (HOSPITAL_COMMUNITY)
Admission: RE | Admit: 2022-05-11 | Discharge: 2022-05-11 | Disposition: A | Discharge status: Home / Self Care | Payer: Medicare HMO | Source: Ambulatory Visit | Attending: Internal Medicine | Admitting: Internal Medicine

## 2022-05-11 DIAGNOSIS — Z122 Encounter for screening for malignant neoplasm of respiratory organs: Secondary | ICD-10-CM | POA: Insufficient documentation

## 2022-05-11 DIAGNOSIS — F17200 Nicotine dependence, unspecified, uncomplicated: Secondary | ICD-10-CM

## 2022-05-11 DIAGNOSIS — F17210 Nicotine dependence, cigarettes, uncomplicated: Secondary | ICD-10-CM | POA: Diagnosis not present

## 2022-05-11 DIAGNOSIS — I700 Atherosclerosis of aorta: Secondary | ICD-10-CM | POA: Insufficient documentation

## 2022-05-11 DIAGNOSIS — J439 Emphysema, unspecified: Secondary | ICD-10-CM | POA: Diagnosis not present

## 2022-05-11 DIAGNOSIS — I2510 Atherosclerotic heart disease of native coronary artery without angina pectoris: Secondary | ICD-10-CM | POA: Diagnosis not present

## 2022-09-18 IMAGING — CT CT CHEST LUNG CANCER SCREENING LOW DOSE W/O CM
2 of 3 series · 15 of 36 positions shown, 18 images · non-contrast
Comparison: CT cancer screening dated April 09, 2020

CLINICAL DATA: Current smoker with 56 pack-year history

EXAM:
CT CHEST WITHOUT CONTRAST LOW-DOSE FOR LUNG CANCER SCREENING
TECHNIQUE: Multidetector CT imaging of the chest was performed following the
standard protocol without IV contrast.

[Series 2: axial st · axial · 0.64mm/px · z∈[+1203,+1483]mm · 12 of 66 slices shown, 15 images]
[im 5/66  mediastinal]
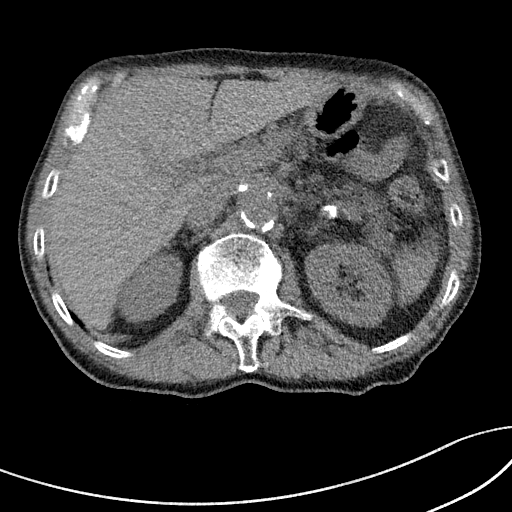
[im 5/66  lung]
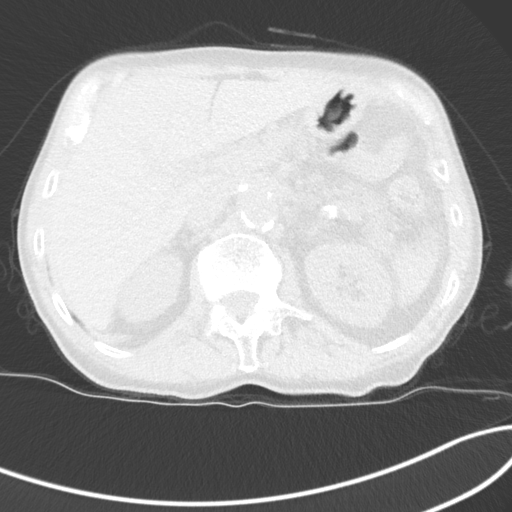
[im 10/66  lung]
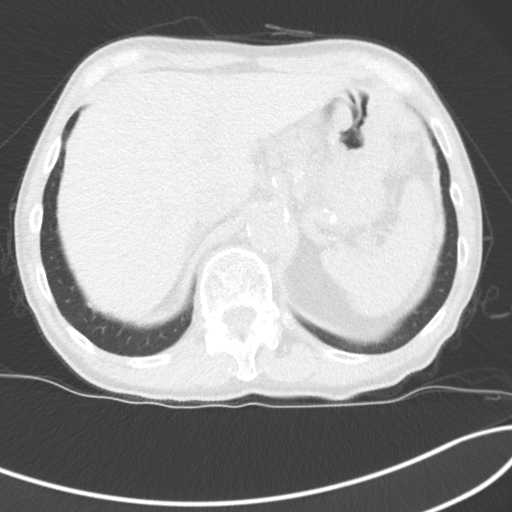
[im 15/66  lung]
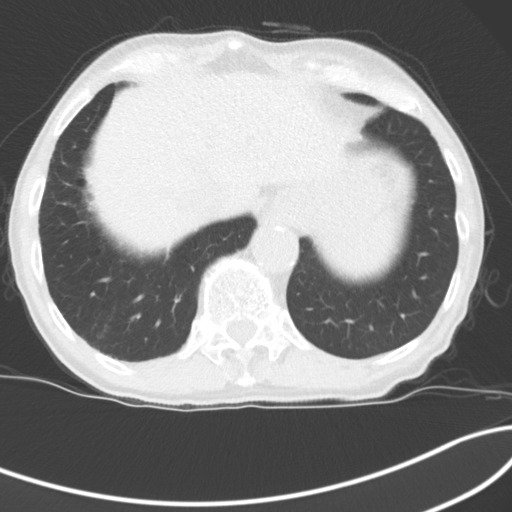
[im 20/66  lung]
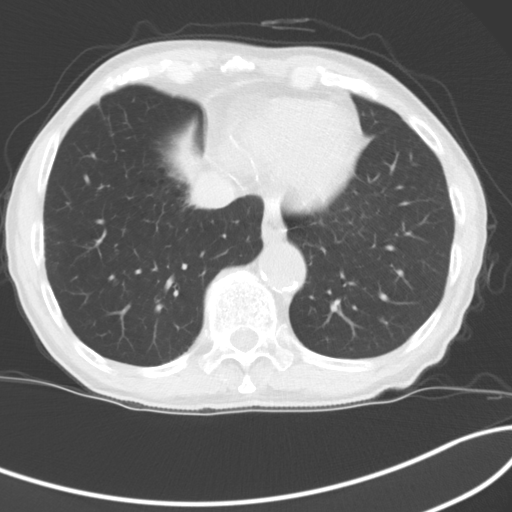
[im 25/66  mediastinal]
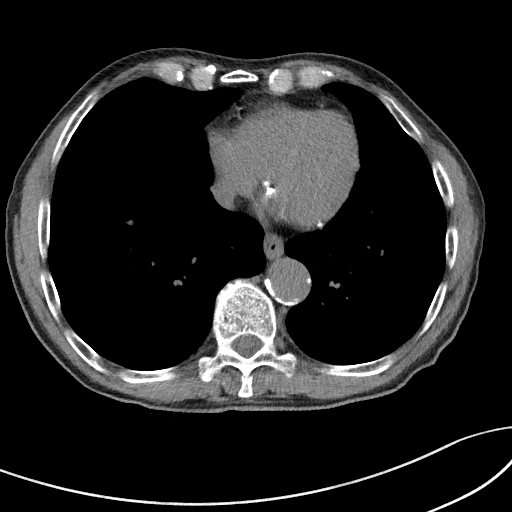
[im 25/66  lung]
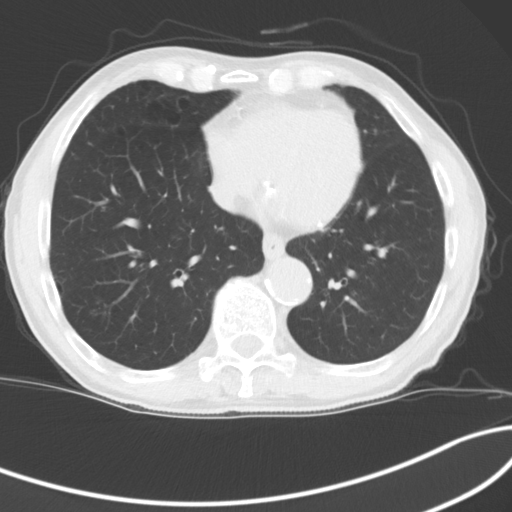
[im 29/66  lung]
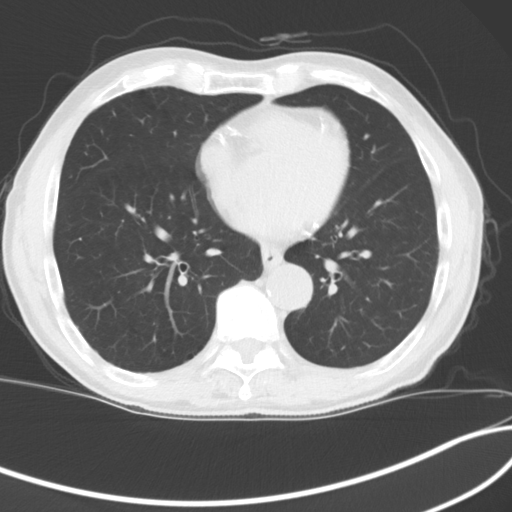
[im 37/66  lung]
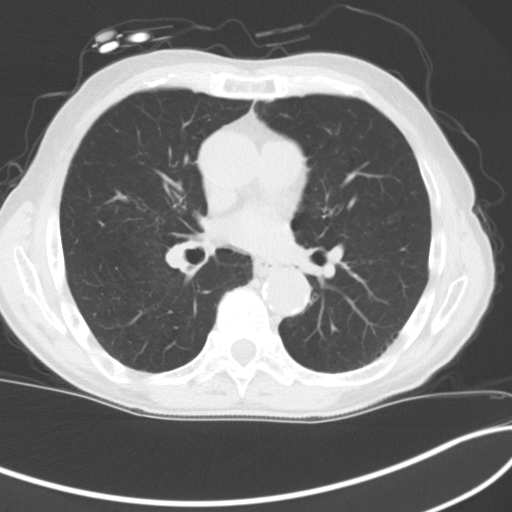
[im 41/66  lung]
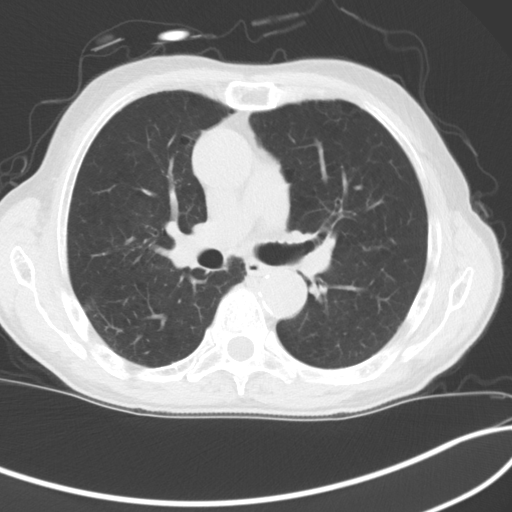
[im 46/66  mediastinal]
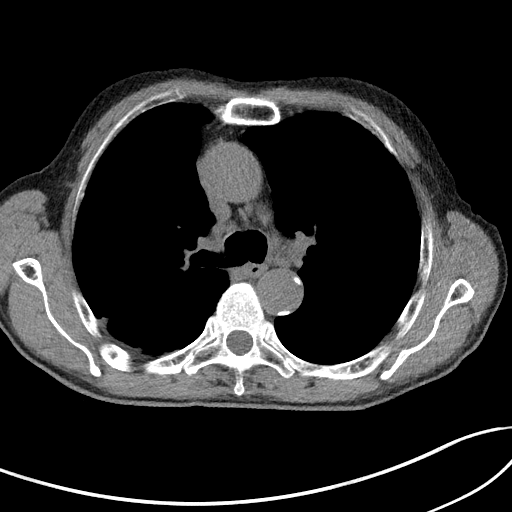
[im 46/66  lung]
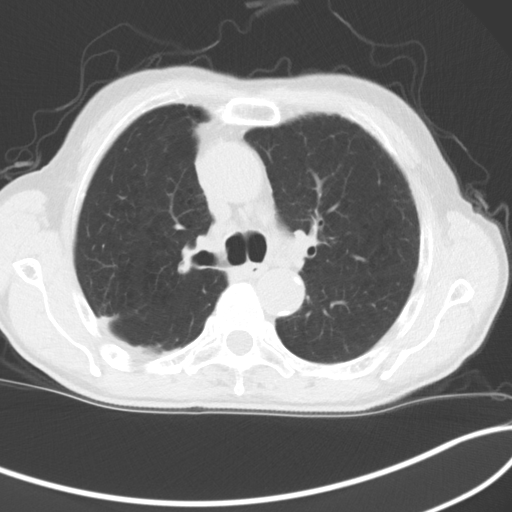
[im 51/66  lung]
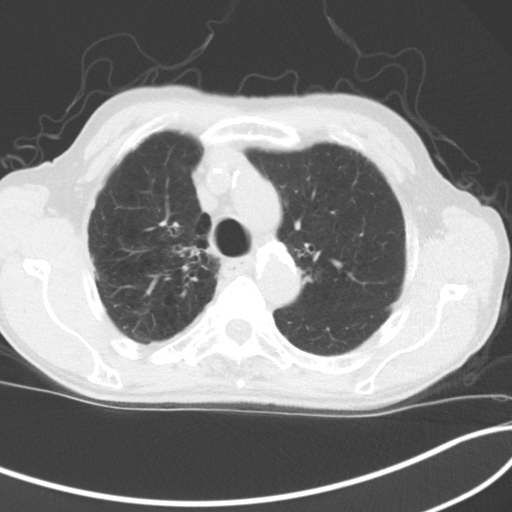
[im 56/66  lung]
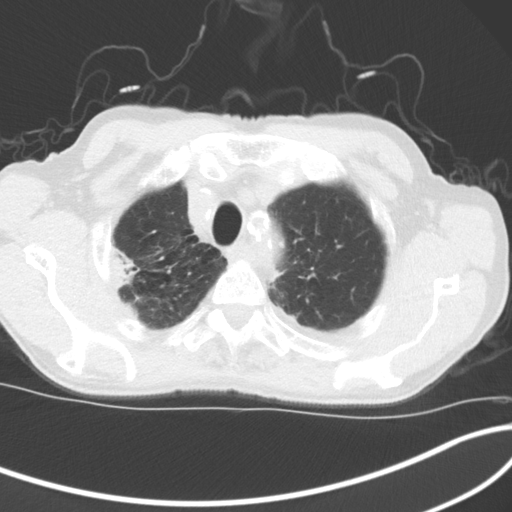
[im 61/66  lung]
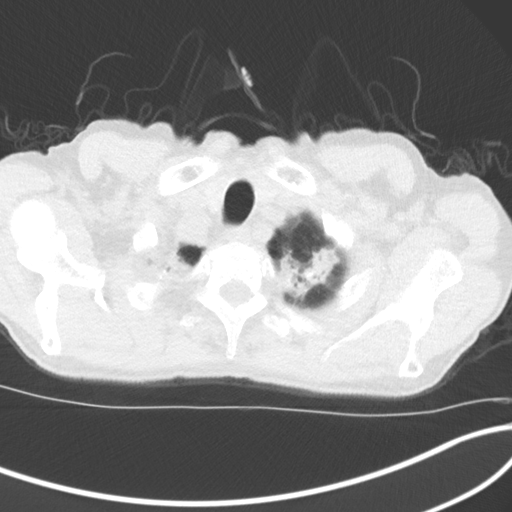

[Series 5: coronal · coronal · 0.66mm/px · 3 of 293 slices shown]
[im 59/293  lung]
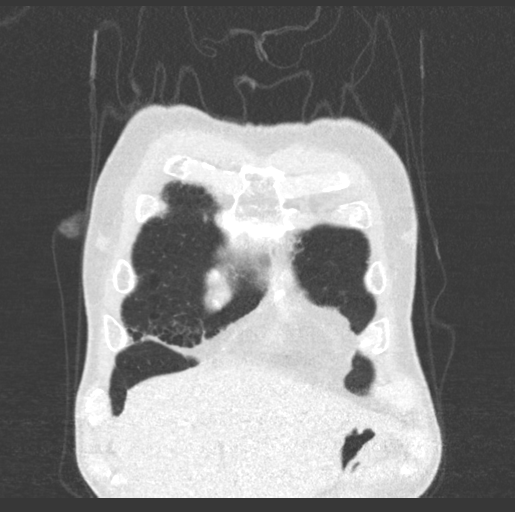
[im 117/293  lung]
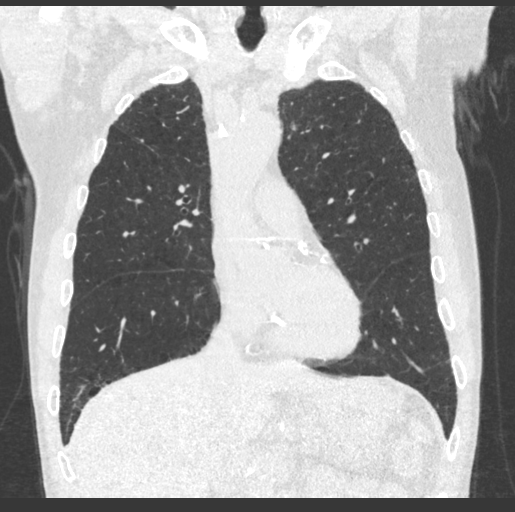
[im 176/293  lung]
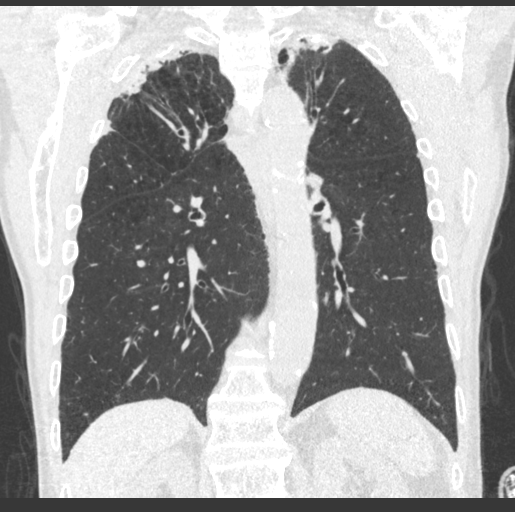

[15 of 36 positions shown; findings below may reference images not displayed]

FINDINGS: Cardiovascular: Normal heart size. No pericardial effusion.
Three-vessel coronary artery calcifications. Atherosclerotic disease
of the thoracic aorta.

Mediastinum/Nodes: Esophagus and thyroid are unremarkable. No
pathologically enlarged lymph nodes seen in the chest.

Lungs/Pleura: Biapical subpleural consolidations and upper lobe
bronchiectasis, unchanged compared to prior exam. Centrilobular and
paraseptal emphysema. No consolidation, pleural effusion or
pneumothorax. New small solid nodule of the right upper lobe
measuring 3.6 mm on series 4, image 87.

Upper Abdomen: No acute abnormality.

Musculoskeletal: No chest wall mass or suspicious bone lesions
identified.
IMPRESSION: Lung-RADS 2, benign appearance or behavior. Continue annual
screening with low-dose chest CT without contrast in 12 months.

Three-vessel coronary artery calcifications.

Aortic Atherosclerosis ([id number]-[id number].W) and Emphysema ([id number].B).

## 2024-05-02 ENCOUNTER — Other Ambulatory Visit (HOSPITAL_COMMUNITY): Payer: Self-pay | Admitting: Internal Medicine

## 2024-05-02 DIAGNOSIS — F17200 Nicotine dependence, unspecified, uncomplicated: Secondary | ICD-10-CM

## 2024-05-13 ENCOUNTER — Ambulatory Visit (HOSPITAL_COMMUNITY)

## 2024-05-13 ENCOUNTER — Encounter (HOSPITAL_COMMUNITY): Payer: Self-pay

## 2024-05-19 ENCOUNTER — Ambulatory Visit (HOSPITAL_COMMUNITY)
Admission: RE | Admit: 2024-05-19 | Discharge: 2024-05-19 | Disposition: A | Discharge status: Home / Self Care | Source: Ambulatory Visit | Attending: Internal Medicine | Admitting: Internal Medicine

## 2024-05-19 DIAGNOSIS — M8580 Other specified disorders of bone density and structure, unspecified site: Secondary | ICD-10-CM | POA: Diagnosis not present

## 2024-05-19 DIAGNOSIS — J439 Emphysema, unspecified: Secondary | ICD-10-CM | POA: Insufficient documentation

## 2024-05-19 DIAGNOSIS — I700 Atherosclerosis of aorta: Secondary | ICD-10-CM | POA: Diagnosis not present

## 2024-05-19 DIAGNOSIS — F17210 Nicotine dependence, cigarettes, uncomplicated: Secondary | ICD-10-CM | POA: Diagnosis not present

## 2024-05-19 DIAGNOSIS — F17200 Nicotine dependence, unspecified, uncomplicated: Secondary | ICD-10-CM | POA: Insufficient documentation

## 2024-05-19 DIAGNOSIS — Z122 Encounter for screening for malignant neoplasm of respiratory organs: Secondary | ICD-10-CM | POA: Diagnosis present

## 2024-05-19 DIAGNOSIS — I2510 Atherosclerotic heart disease of native coronary artery without angina pectoris: Secondary | ICD-10-CM | POA: Diagnosis not present
